# Patient Record
Sex: Female | Born: 1975 | Race: White | Hispanic: No | Marital: Married | Smoking: Current every day smoker
Health system: Southern US, Community
[De-identification: ages and names within clinical notes are randomized; demographics above are authoritative.]

## PROBLEM LIST (undated history)

## (undated) DIAGNOSIS — R3915 Urgency of urination: Secondary | ICD-10-CM

## (undated) DIAGNOSIS — K2970 Gastritis, unspecified, without bleeding: Secondary | ICD-10-CM

## (undated) DIAGNOSIS — N3010 Interstitial cystitis (chronic) without hematuria: Secondary | ICD-10-CM

## (undated) DIAGNOSIS — N941 Unspecified dyspareunia: Secondary | ICD-10-CM

## (undated) HISTORY — PX: ABDOMINAL HYSTERECTOMY: SHX81

## (undated) HISTORY — PX: BLADDER SURGERY: SHX569

---

## 2005-09-19 ENCOUNTER — Emergency Department: Payer: Self-pay | Admitting: Emergency Medicine

## 2006-01-16 ENCOUNTER — Emergency Department: Payer: Self-pay | Admitting: Emergency Medicine

## 2006-03-09 ENCOUNTER — Emergency Department: Payer: Self-pay | Admitting: Emergency Medicine

## 2006-04-22 ENCOUNTER — Inpatient Hospital Stay (HOSPITAL_COMMUNITY): Admission: AD | Admit: 2006-04-22 | Discharge: 2006-04-22 | Payer: Self-pay | Admitting: Gynecology

## 2006-04-28 ENCOUNTER — Ambulatory Visit: Payer: Self-pay | Admitting: Obstetrics & Gynecology

## 2006-04-28 ENCOUNTER — Ambulatory Visit: Payer: Self-pay | Admitting: Family Medicine

## 2006-04-28 ENCOUNTER — Encounter: Payer: Self-pay | Admitting: Family Medicine

## 2006-06-24 ENCOUNTER — Ambulatory Visit: Payer: Self-pay | Admitting: Family Medicine

## 2006-06-24 ENCOUNTER — Inpatient Hospital Stay (HOSPITAL_COMMUNITY): Admission: RE | Admit: 2006-06-24 | Discharge: 2006-06-26 | Payer: Self-pay | Admitting: Family Medicine

## 2006-06-24 ENCOUNTER — Encounter (INDEPENDENT_AMBULATORY_CARE_PROVIDER_SITE_OTHER): Payer: Self-pay | Admitting: Specialist

## 2006-06-29 ENCOUNTER — Ambulatory Visit: Payer: Self-pay | Admitting: Family Medicine

## 2006-07-07 ENCOUNTER — Ambulatory Visit: Payer: Self-pay | Admitting: Family Medicine

## 2006-07-20 ENCOUNTER — Inpatient Hospital Stay (HOSPITAL_COMMUNITY): Admission: AD | Admit: 2006-07-20 | Discharge: 2006-07-20 | Payer: Self-pay | Admitting: Gynecology

## 2006-07-28 ENCOUNTER — Ambulatory Visit: Payer: Self-pay | Admitting: Family Medicine

## 2006-08-04 ENCOUNTER — Ambulatory Visit (HOSPITAL_COMMUNITY): Admission: RE | Admit: 2006-08-04 | Discharge: 2006-08-04 | Payer: Self-pay | Admitting: Family Medicine

## 2006-10-25 ENCOUNTER — Emergency Department: Payer: Self-pay

## 2006-10-26 ENCOUNTER — Emergency Department: Payer: Self-pay | Admitting: Emergency Medicine

## 2006-12-01 ENCOUNTER — Ambulatory Visit: Payer: Self-pay | Admitting: Family Medicine

## 2007-03-26 IMAGING — US US TRANSVAGINAL NON-OB
1 series · 14 of 22 positions shown · non-contrast
Comparison: none

CLINICAL DATA: Followup left adnexal mass from 07/20/06 ultrasound; history of hysterectomy and right oophorectomy on 06/24/06. 
 TRANSVAGINAL PELVIC ULTRASOUND:
TECHNIQUE: Transvaginal ultrasound examination of the pelvis was performed including evaluation of the uterus, ovaries, adnexal regions, and pelvic cul-de-sac.

[Series 1: us transvaginal non-ob · 0.13mm/px · 14 of 22 slices shown]
[im 1/22]
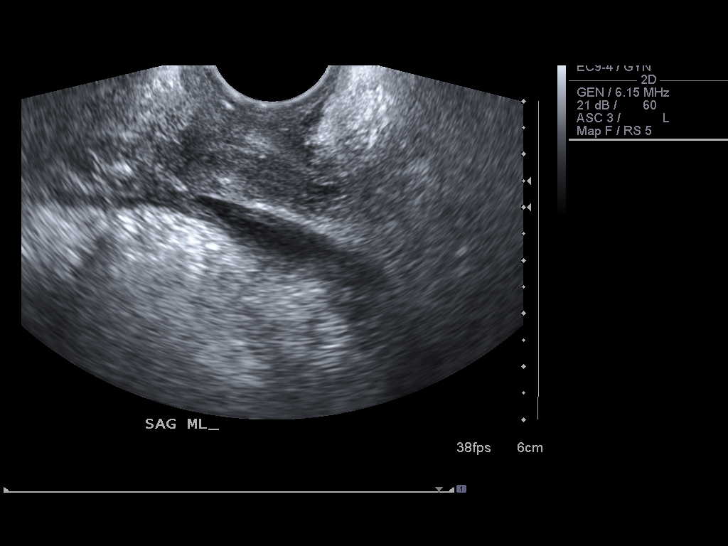
[im 3/22]
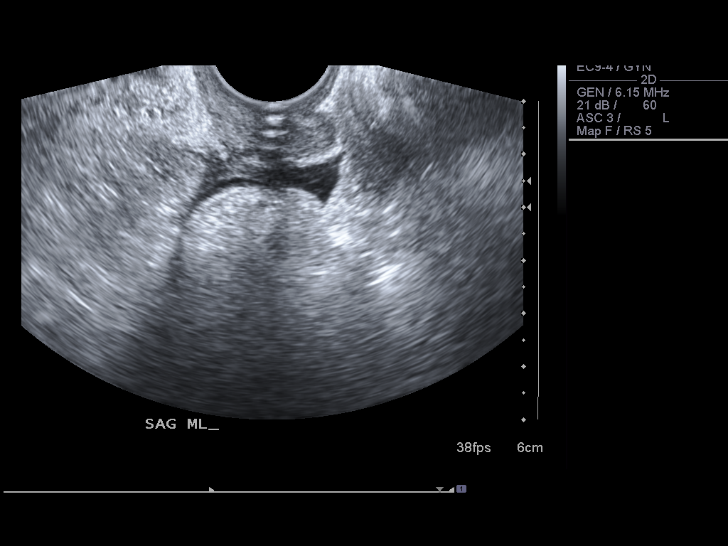
[im 4/22]
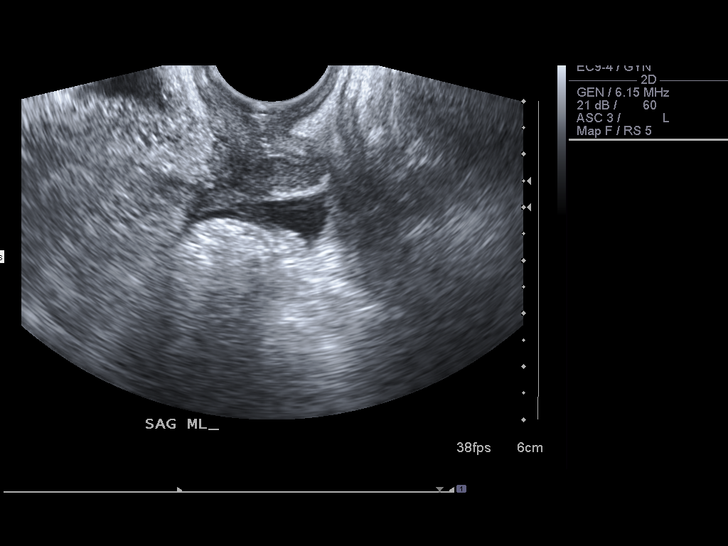
[im 6/22]
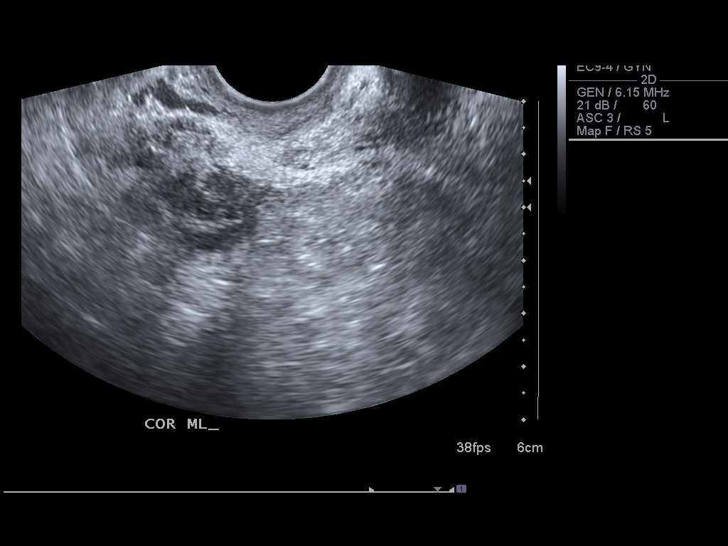
[im 8/22]
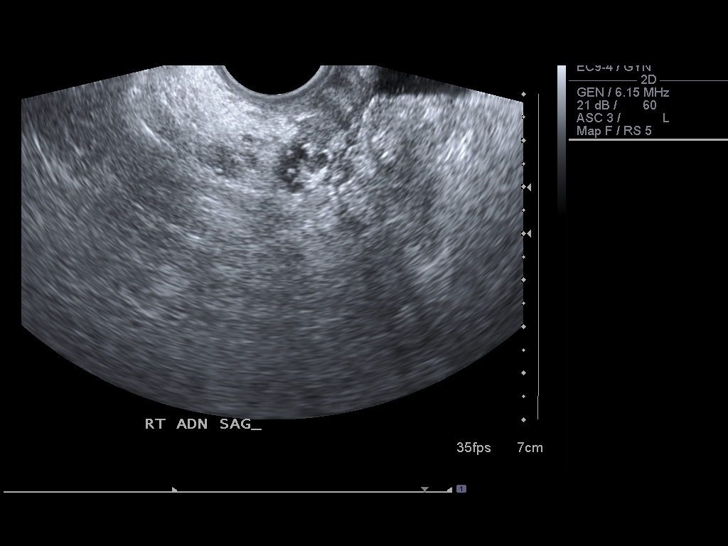
[im 9/22]
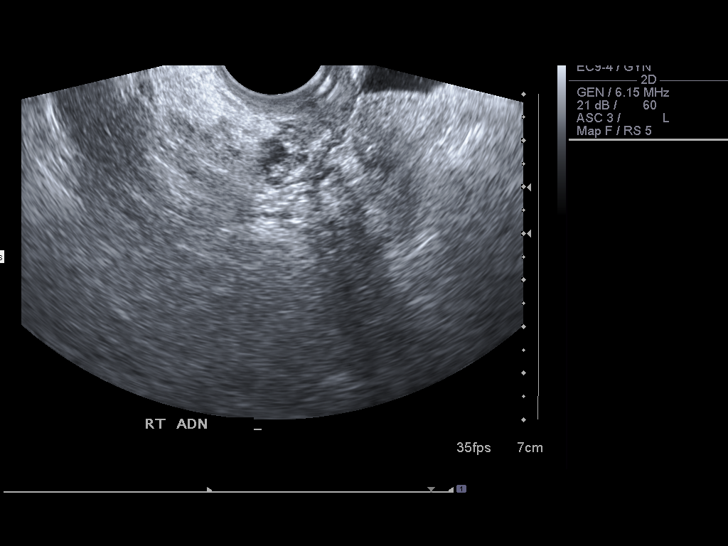
[im 11/22]
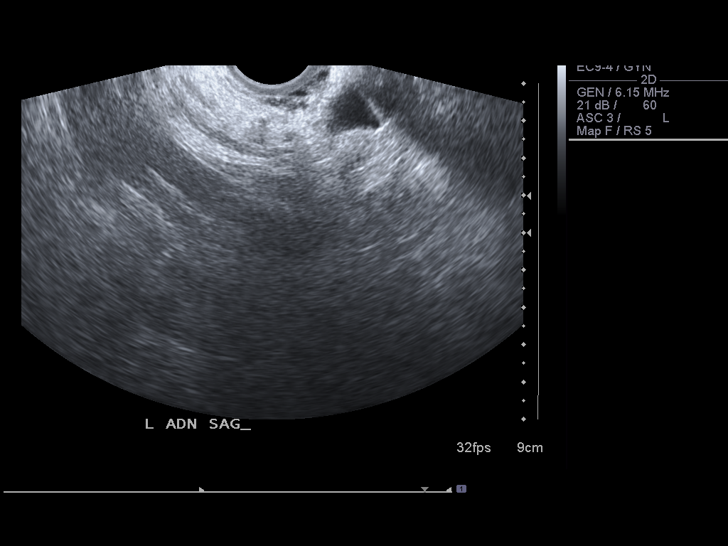
[im 12/22]
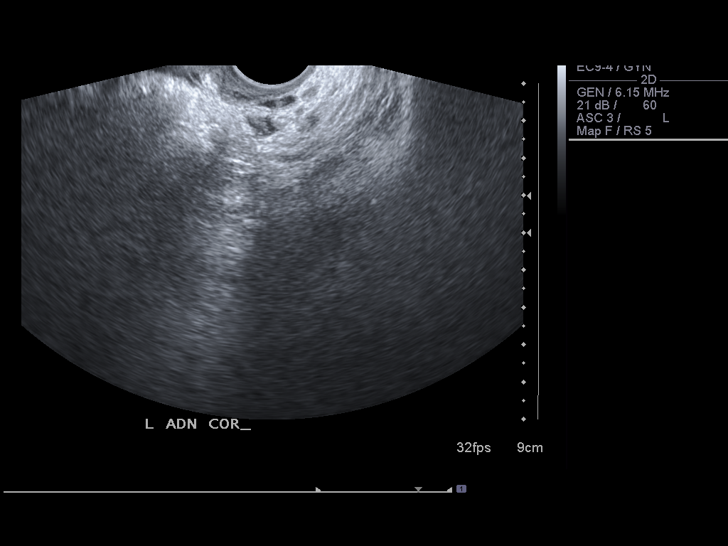
[im 14/22]
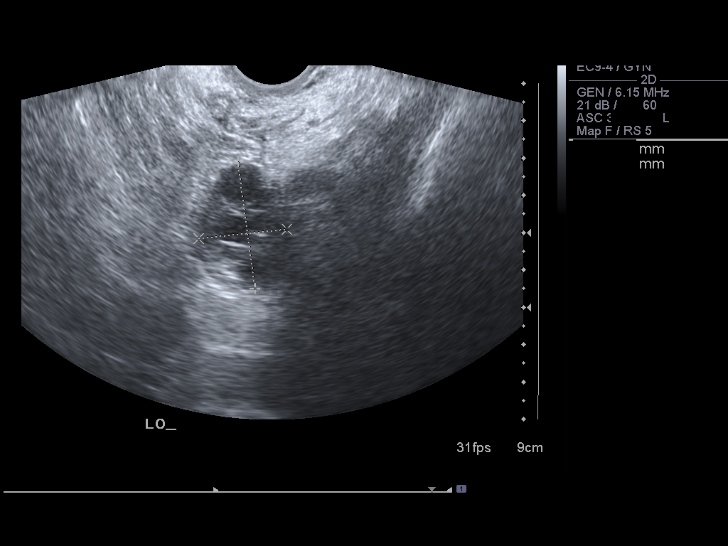
[im 15/22]
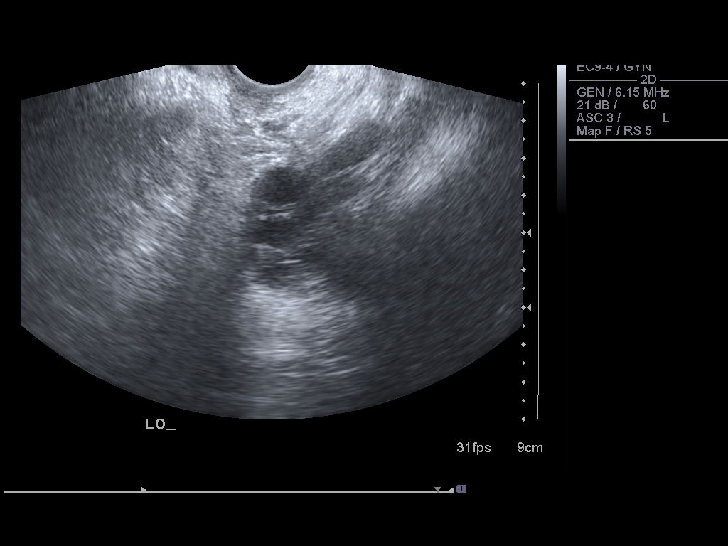
[im 17/22]
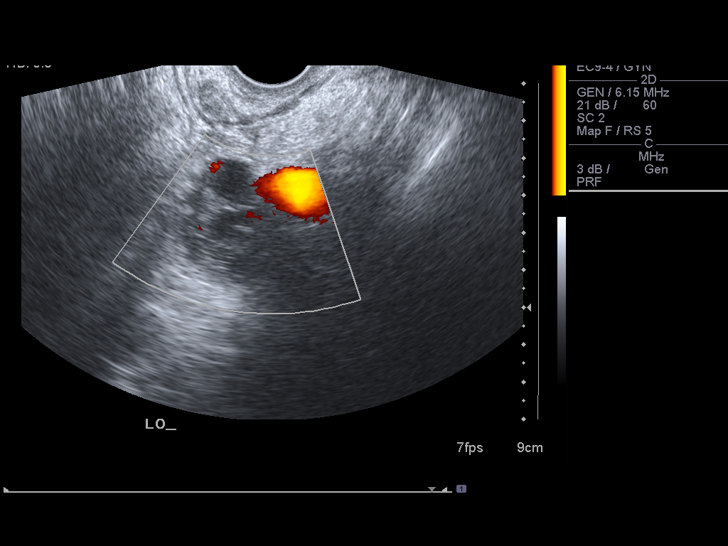
[im 19/22]
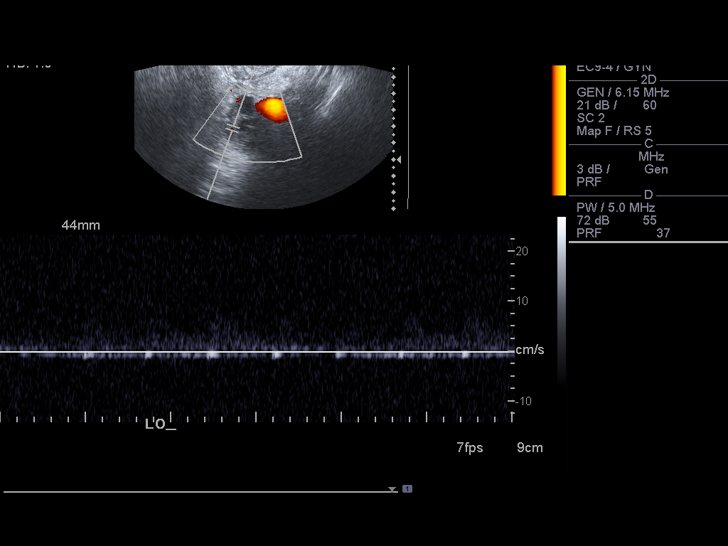
[im 20/22]
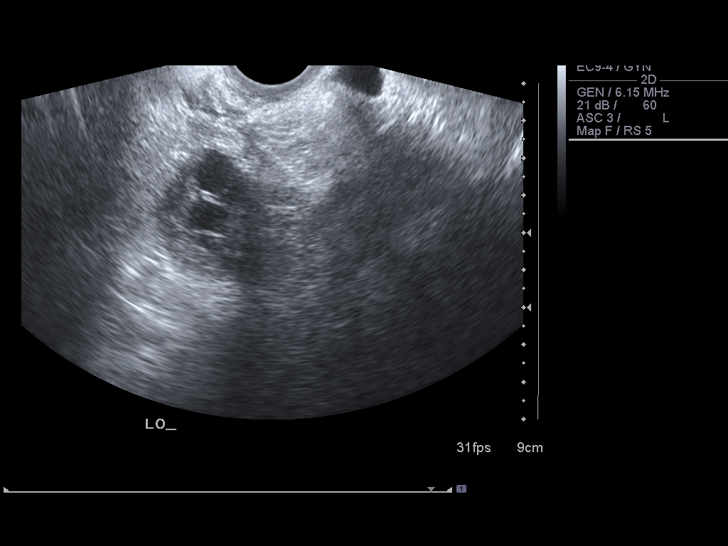
[im 22/22]
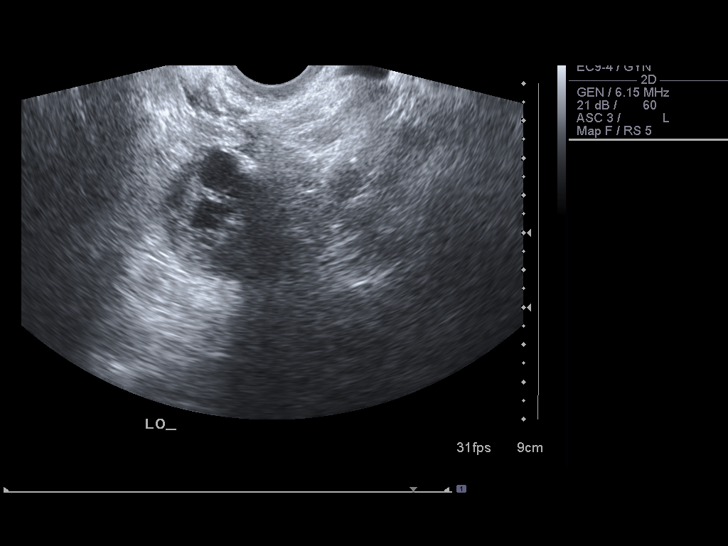

[14 of 22 positions shown; findings below may reference images not displayed]

FINDINGS: The left adnexal complex cyst which was previously seen on the 07/20/06 ultrasound is not present today.  The left ovary has a normal appearance today containing several small follicles.  Arterial and venous flow is present within the left ovary.  A minimal amount of free pelvic is incidentally noted.  The vaginal cuff has an unremarkable appearance.
IMPRESSION: 1. Interval resolution of left adnexal complex cyst since the 07/20/06 exam.
 2. The left ovary is well seen today and has a normal appearance.

## 2007-04-09 ENCOUNTER — Emergency Department: Payer: Self-pay | Admitting: Emergency Medicine

## 2007-04-12 ENCOUNTER — Emergency Department: Payer: Self-pay | Admitting: Emergency Medicine

## 2007-05-11 ENCOUNTER — Emergency Department: Payer: Self-pay | Admitting: Emergency Medicine

## 2007-11-08 ENCOUNTER — Emergency Department: Payer: Self-pay | Admitting: Emergency Medicine

## 2008-02-21 ENCOUNTER — Ambulatory Visit: Payer: Self-pay | Admitting: Family Medicine

## 2008-04-06 ENCOUNTER — Ambulatory Visit: Payer: Self-pay | Admitting: Urology

## 2008-04-13 ENCOUNTER — Ambulatory Visit: Payer: Self-pay | Admitting: Urology

## 2008-04-25 ENCOUNTER — Ambulatory Visit: Payer: Self-pay | Admitting: Family Medicine

## 2008-05-31 ENCOUNTER — Ambulatory Visit: Payer: Self-pay | Admitting: Obstetrics and Gynecology

## 2008-07-03 ENCOUNTER — Ambulatory Visit: Payer: Self-pay | Admitting: Family Medicine

## 2008-07-03 ENCOUNTER — Inpatient Hospital Stay (HOSPITAL_COMMUNITY): Admission: RE | Admit: 2008-07-03 | Discharge: 2008-07-05 | Payer: Self-pay | Admitting: Family Medicine

## 2008-07-03 ENCOUNTER — Encounter: Payer: Self-pay | Admitting: Family Medicine

## 2008-07-06 ENCOUNTER — Ambulatory Visit: Payer: Self-pay | Admitting: Family Medicine

## 2008-08-07 ENCOUNTER — Ambulatory Visit: Payer: Self-pay | Admitting: Obstetrics and Gynecology

## 2008-09-14 ENCOUNTER — Ambulatory Visit: Payer: Self-pay | Admitting: Family Medicine

## 2008-10-24 ENCOUNTER — Ambulatory Visit: Payer: Self-pay | Admitting: Obstetrics and Gynecology

## 2008-11-06 ENCOUNTER — Ambulatory Visit: Payer: Self-pay | Admitting: Internal Medicine

## 2008-11-06 DIAGNOSIS — R197 Diarrhea, unspecified: Secondary | ICD-10-CM

## 2008-11-06 DIAGNOSIS — R1031 Right lower quadrant pain: Secondary | ICD-10-CM

## 2008-11-06 LAB — CONVERTED CEMR LAB
ALT: 30 units/L (ref 0–35)
Albumin: 3.9 g/dL (ref 3.5–5.2)
Basophils Absolute: 0 10*3/uL (ref 0.0–0.1)
CO2: 32 meq/L (ref 19–32)
Calcium: 9.1 mg/dL (ref 8.4–10.5)
Chloride: 107 meq/L (ref 96–112)
Creatinine, Ser: 0.7 mg/dL (ref 0.4–1.2)
GFR calc non Af Amer: 102.67 mL/min (ref >60)
Hemoglobin: 13.1 g/dL (ref 12.0–15.0)
Lymphocytes Relative: 38.3 % (ref 12.0–46.0)
Monocytes Relative: 3.9 % (ref 3.0–12.0)
Neutro Abs: 2.4 10*3/uL (ref 1.4–7.7)
RBC: 4.27 M/uL (ref 3.87–5.11)
RDW: 12.4 % (ref 11.5–14.6)
Total Protein: 6.7 g/dL (ref 6.0–8.3)

## 2008-11-13 ENCOUNTER — Telehealth: Payer: Self-pay | Admitting: Internal Medicine

## 2008-11-20 ENCOUNTER — Encounter (INDEPENDENT_AMBULATORY_CARE_PROVIDER_SITE_OTHER): Payer: Self-pay | Admitting: *Deleted

## 2008-11-21 ENCOUNTER — Ambulatory Visit: Payer: Self-pay | Admitting: Internal Medicine

## 2008-11-27 ENCOUNTER — Telehealth: Payer: Self-pay | Admitting: Internal Medicine

## 2008-12-04 ENCOUNTER — Encounter (INDEPENDENT_AMBULATORY_CARE_PROVIDER_SITE_OTHER): Payer: Self-pay

## 2008-12-06 ENCOUNTER — Telehealth (INDEPENDENT_AMBULATORY_CARE_PROVIDER_SITE_OTHER): Payer: Self-pay

## 2009-07-16 ENCOUNTER — Emergency Department: Payer: Self-pay | Admitting: Emergency Medicine

## 2010-01-23 ENCOUNTER — Ambulatory Visit: Payer: Self-pay | Admitting: Family Medicine

## 2010-01-24 ENCOUNTER — Encounter: Payer: Self-pay | Admitting: Family Medicine

## 2010-01-24 ENCOUNTER — Ambulatory Visit: Payer: Self-pay | Admitting: Obstetrics and Gynecology

## 2010-01-24 LAB — CONVERTED CEMR LAB: FSH: 40.4 milliintl units/mL

## 2010-04-15 ENCOUNTER — Ambulatory Visit: Payer: Self-pay | Admitting: Obstetrics and Gynecology

## 2010-04-23 ENCOUNTER — Ambulatory Visit: Payer: Self-pay | Admitting: Family Medicine

## 2010-05-06 ENCOUNTER — Ambulatory Visit (HOSPITAL_COMMUNITY): Admission: RE | Admit: 2010-05-06 | Discharge: 2010-05-06 | Payer: Self-pay | Admitting: Family Medicine

## 2010-05-06 ENCOUNTER — Ambulatory Visit: Payer: Self-pay | Admitting: Family Medicine

## 2010-05-28 ENCOUNTER — Ambulatory Visit: Payer: Self-pay | Admitting: Family Medicine

## 2010-08-11 ENCOUNTER — Encounter: Payer: Self-pay | Admitting: Gynecology

## 2010-08-11 ENCOUNTER — Encounter: Payer: Self-pay | Admitting: Family Medicine

## 2010-10-02 LAB — CBC
HCT: 43.8 % (ref 36.0–46.0)
MCH: 29.8 pg (ref 26.0–34.0)
MCHC: 33.5 g/dL (ref 30.0–36.0)
MCV: 89 fL (ref 78.0–100.0)
RDW: 13.7 % (ref 11.5–15.5)

## 2010-10-22 ENCOUNTER — Ambulatory Visit: Payer: Self-pay | Admitting: Obstetrics and Gynecology

## 2010-10-28 ENCOUNTER — Ambulatory Visit: Payer: Medicaid Other | Admitting: Obstetrics & Gynecology

## 2010-10-28 DIAGNOSIS — N951 Menopausal and female climacteric states: Secondary | ICD-10-CM

## 2010-10-28 DIAGNOSIS — L748 Other eccrine sweat disorders: Secondary | ICD-10-CM

## 2010-10-29 NOTE — Assessment & Plan Note (Signed)
[name], [name] NO.:  [id number]  MEDICAL RECORD NO.:  [id number]           PATIENT TYPE:  LOCATION:  [facility] at [facility]           FACILITY:  PHYSICIAN:  Argentina Donovan, MD        DATE OF BIRTH:  08-04-1975  DATE OF SERVICE:  10/28/2010                                 CLINIC NOTE  The patient is a 35 year old gravida 2, para 2-0-0-2, who underwent total abdominal hysterectomy, right salpingo-oophorectomy which was later followed by left salpingo-oophorectomy.  She was placed on Vivelle patches for hot flashes, but they keep sweating so much they are coming off, want to switch her to Premarin 1.25, I am not sure the dose for her, she has very small.  We will have her come back in about a month and half and we will evaluate that.  In addition to this, she has a recurrent Skene gland cyst.  She had a previous abscess which was indeed marsupialized by Dr. Shawnie Pons.  She was also then referred to [facility] apparently, never was able to get there, was unable to be seen by Urology to rule out interstitial cystitis.  We have called [facility] in [place], and I am going to have him see her; #1 for the Skene gland, recurrent cyst, and also for the possibility of interstitial cystitis, if she continues to have pelvic pain in spite of having all gynecological organs removed, but I do not see any pathology here on the chart, and she said everything seemed to being stuck together with bad adhesions.  In addition to this, she is right at the present time quite miserable with discomfort, so I am going to give her some Percocet to cover her until she is seen by Urology.  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          ______________________________ Argentina Donovan, MD    PR/MEDQ  D:  10/28/2010  T:  10/29/2010  Job:  [id number]

## 2010-11-25 ENCOUNTER — Ambulatory Visit (INDEPENDENT_AMBULATORY_CARE_PROVIDER_SITE_OTHER): Payer: Medicaid Other | Admitting: Obstetrics & Gynecology

## 2010-11-25 DIAGNOSIS — N3090 Cystitis, unspecified without hematuria: Secondary | ICD-10-CM

## 2010-11-26 ENCOUNTER — Ambulatory Visit: Payer: Medicaid Other | Admitting: Family Medicine

## 2010-11-26 NOTE — Assessment & Plan Note (Signed)
[name], HEATHER NO.:  [id number]  MEDICAL RECORD NO.:  [id number]           PATIENT TYPE:  LOCATION:  [facility] at [facility]           FACILITY:  PHYSICIAN:  Scheryl Darter, MD       DATE OF BIRTH:  1976-01-28  DATE OF SERVICE:  11/25/2010                                 CLINIC NOTE  The patient returns today for followup after she saw Dr. Okey Dupre on October 29, 2010.  She has been referred to urologist at [facility] to rule out interstitial cystitis as well as to manage possible periurethral cyst.  She is scheduled to see Dr. Perley Jain on Dec 04, 2010.  She is likely to be scheduling her for management of the periurethral cyst as well as diagnostic procedures for possible interstitial cystitis.  When she was seen by Dr. Isabel Caprice on November 14, 2010, he gave her prescription for Percocet 5/325, 30 tablets, 1-2 p.o. q.4-6 hours as needed.  She has now run out of her prescription.  For about the last year, she has been on Percocet.  She requests a refill today.  She says she is doing well with her Premarin and she has refills.  It seems she has taken 2-3 Percocet a day now.  I discussed that she is at risk to become physically dependent on the narcotics, and would like to avoid that.  What we agreed to do today is for her to have a prescription for Percocet 5/325, 20 tablets one p.o. q.4-6 hours for pain.  No refills.  We will not refill this prescription here, and she should notify the urologist of that.  Because she has frequency and pain at night and difficulty sleeping, I gave her prescription for amitriptyline 25 mg p.o. nightly.  For now, she will follow with the urologist and I did not schedule any specific followup related to this at our office.     Scheryl Darter, MD    JA/MEDQ  D:  11/25/2010  T:  11/26/2010  Job:  [id number]

## 2010-12-03 NOTE — Assessment & Plan Note (Signed)
[name], [name]                ACCOUNT NO.:  [id number]   MEDICAL RECORD NO.:  [id number]          PATIENT TYPE:  POB   LOCATION:  [facility] at [facility]         FACILITY:  [facility]   PHYSICIAN:  Tinnie Gens, MD        DATE OF BIRTH:  02/28/1976   DATE OF SERVICE:  04/25/2008                                  CLINIC NOTE   CHIEF COMPLAINT:  Left-sided pelvic pain or abdominal pain.   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  The patient now has a left-sided  abdominal mass approximately 6 cm in diameter it has been present for  since pretty much postoperatively.  She has continued to have pain here  and desires permanent treatment of this.  The patient is currently a  Consulting civil engineer.  She is out of class on 12/12 and would like surgery scheduled  at that time.  The patient has a history of a peek and shriek with her  last laparoscopy and is not a candidate for laparoscopic removal of this  mass.   On exam, vitals are noted as in the chart.  She well-nourished female in  no acute distress.  Abdomen soft, nondistended.  It is tender in the  left lower quadrant.  Ultrasound reveals a 6 x 3 cm mass that is  approximately 5 cm from the abdominal wall, definitely unclear etiology  and likely related to her ovary.   IMPRESSION:  Left-sided pelvic mass, unclear etiology.   PLAN:  Ex-lap with possible LSO with excision of mass to be scheduled  after 07/01/2008.           ______________________________  Tinnie Gens, MD     TP/MEDQ  D:  04/25/2008  T:  04/26/2008  Job:  [id number]

## 2010-12-03 NOTE — Assessment & Plan Note (Signed)
[name], [name] NO.:  [id number]   MEDICAL RECORD NO.:  [id number]          PATIENT TYPE:  POB   LOCATION:  [facility] at [facility]         FACILITY:  [facility]   PHYSICIAN:  Argentina Donovan, MD        DATE OF BIRTH:  [birth year]   DATE OF SERVICE:  04/15/2010                                  CLINIC NOTE   The patient is a 35 year old Caucasian female who has been a long-time  patient of Dr. Shawnie Pons and has undergone a total abdominal hysterectomy  with right salpingo-oophorectomy and followed by left salpingo-  oophorectomy.  She has been a chronic pain patient for a long time, was  seen and thought to have a diverticulum of the urethra which was then  diagnosed as a Skene gland problem.  When last in July, she had an  abscess and a gland which was locally drained by Dr. Shawnie Pons and  successful.  Her visit today came in with a chief complaint of severe  abdominal pain onset caused by coitus about 1 week ago.  She has chronic  abdominal and pelvic pain without question of and the etiology of some  of her plain is difficult to explain.  However, recently questioning  her, she thinks it is a recurrence of the abscess and the lower  abdominal pain has gotten much worse.  Her urinary symptoms are of note,  although she has seen urologist in the past, it was mainly because of  thinking she had a urethral diverticulum and cystoscopy was not carried  out.  In talking with the patient and listening to her history, I am  fairly certain that this patient probably has interstitial cystitis.  Examination of her abdomen is soft, flat, quite tender in the suprapubic  area to the slightest superficial pressure, but without rebound.  When  the upper abdomen is palpated and compressed, this does increase her  lower abdominal pain.  On genital exam, the external genitalia is normal  and be less examination, there is a shiny bulging of the left Skene  gland which is fluctuant and I think  certainly has a recurrence of her  abscess.  When last seen Dr. Shawnie Pons had mentioned if the recurrence  occurred that she would do a I and D in the operating room and I think  that is probably a very good solution if this does not resolve prior to  her being seen by her.  On further examination, the entire vagina seems  to be very tender, but exquisite tenderness and the slightest palpation  of the anterior vaginal wall well above the urethra which certainly  causes immediate movement by the patient and wincing which I think  further points to the diagnosis of interstitial cystitis.  I have talked  to her little bit about treatment.  I am going to have her go back and  see her urologist and I think she does need a cystoscopy to confirm the  diagnosis.  I am going to put her on doxycycline and sitz baths in hopes  that she gets some kind of a relief and perhaps that this abscess will  either  resolve for the self-drain within the week until she sees Dr.  Shawnie Pons if not I think that in the operating room, a good I and D is  probably indicated, so she is going to start sitz baths, doxycycline.  I  am going to renew her Percocet as she definitely needs something for  pain at this point.  In addition to this fact that the patient is 20  years younger than the average menopause and on no estrogen for a year,  I am not quite clear on the reason that the internist suggested that she  stop the estrogen, but for her physical maintenance vaginal lubrication  in the cycle emetic effect of the estrogen, I think that she would be  better off back on estrogen.  That is a decision I will leave between  the patient and Dr. Shawnie Pons.   IMPRESSION:  Abscess of the left Skene gland, chronic pelvic pain  probably increased by what I think is interstitial cystitis.          ______________________________  Argentina Donovan, MD    PR/MEDQ  D:  04/15/2010  T:  04/16/2010  Job:  [id number]

## 2010-12-03 NOTE — Assessment & Plan Note (Signed)
NAMEBRITTNEE, [name]                ACCOUNT NO.:  [id number]   MEDICAL RECORD NO.:  [id number]          PATIENT TYPE:  POB   LOCATION:  [facility] at [facility]         FACILITY:  [facility]   PHYSICIAN:  Tinnie Gens, MD        DATE OF BIRTH:  January 16, 1976   DATE OF SERVICE:  02/21/2008                                  CLINIC NOTE   HISTORY OF PRESENT ILLNESS:  The patient is a 35 year old gravida 2,  para 2 who was previously had TAH, RSO for multiple adhesions and  chronic pelvic pain.  The patient reports that she has continued to have  some left lower quadrant pain, and she has some spottings from the  vagina.  The patient reports that she has 2 or 3 days of pain followed  by spotting, and she feels urgency to urinate.  She sits so much a lot  and strains but nothing really comes out.  She has been taking Aleve,  this is moderately helping.  The patient is, otherwise, being doing  fairly well.  The patient reports some history of a distal urethral mass  done by urologist in [facility] previously.   PHYSICAL EXAMINATION:  VITAL SIGNS:  Today, her vitals are as noted in  the chart.  GENERAL:  She is a well-nourished female in no acute distress.  ABDOMEN:  Soft, nontender, and nondistended.  GU:  There is a mass noted at the top of the introitus.  The vagina is  pink and rugated.  The vaginal cuff is clear and well-estrogenized  mucosa noted.  The patient has a mass anteriorly in the vagina.  It does  not get somewhat bigger with Valsalva.  It does not appear to be the  full bladder, but possibly a urethral diverticulum that is noted here  and with Valsalvas approximately 1.5 cm circumferentially.   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  2. Some left-sided pelvic pain, unclear etiology.   PLAN:  1. Pelvic sonogram to evaluate her left ovary.  2. We would refer to urologist, as I am uncomfortable to treat      urethral diverticula nor the complications of treating them.  3.  Continually her ibuprofen is necessary.  We will check a TSH and      FSH.  The patient has had some weight loss and some diarrheal      stools lately and some mood changes.           ______________________________  Tinnie Gens, MD     TP/MEDQ  D:  02/21/2008  T:  02/22/2008  Job:  [id number]

## 2010-12-03 NOTE — Assessment & Plan Note (Signed)
[name], [name]                ACCOUNT NO.:  [id number]   MEDICAL RECORD NO.:  [id number]          PATIENT TYPE:  POB   LOCATION:  [facility] at [facility]         FACILITY:  [facility]   PHYSICIAN:  Tinnie Gens, MD        DATE OF BIRTH:  1976-06-14   DATE OF SERVICE:  09/14/2008                                  CLINIC NOTE   CHIEF COMPLAINT:  Followup.   HISTORY OF PRESENT ILLNESS:  The patient is a 35 year old, gravida 2,  para 2 who has undergone TAH and RSO, followed by exploratory laparotomy  and LSO and  who has surgical menopause.  The patient had previously  been on Vivelle patch 0.1 and came in with complaints of anger and  management issues and trouble sleeping possibly sweats at night, she  told my partner.  He prescribed to her Premarin plus testosterone, which  she did not tolerate well.  She did not go quite into detail about  exactly what was intolerable about the medication, but then she switched  back to Vivelle.  She reports she has no menopausal symptoms, she is not  having hot flashes, not waken up at night, does not have a night sweats,  but she continues to have issue with short temper and anger management.  She would like some medication to help with this.  She has no history of  being on antidepressants before.  She does not really felt anxious or  nervous, she is in school full time, and try to raise her children and  does find that she is overwhelmed.   On exam today, her vital signs are noted in the chart.  She is a well  developed, well nourished, in no acute distress.  Abdomen:  Soft,  nontender, and nondistended.   IMPRESSION:  Short temper and anger, questionably related to menopausal  effect versus a slight stressors.   PLAN:  We will start Celexa 10 mg p.o. daily for a week and increase to  20 mg p.o. daily.  She will follow up in 4 weeks to see how this is  working for her.  Would give her at least 2 months of medication, if  that fails to help her,  would consider a referral to a therapist.           ______________________________  Tinnie Gens, MD     TP/MEDQ  D:  09/14/2008  T:  09/14/2008  Job:  [id number]

## 2010-12-03 NOTE — Op Note (Signed)
NAMECRYSTALEE, [name]                ACCOUNT NO.:  [id number]   MEDICAL RECORD NO.:  [id number]          PATIENT TYPE:  INP   LOCATION:  9312                          FACILITY:  [facility]   PHYSICIAN:  Tanya S. Shawnie Pons, M.D.   DATE OF BIRTH:  1976-03-18   DATE OF PROCEDURE:  07/03/2008  DATE OF DISCHARGE:                               OPERATIVE REPORT   PREOPERATIVE DIAGNOSIS:  Left ovarian cyst.   POSTOPERATIVE DIAGNOSIS:  Left ovarian cyst.   PROCEDURE:  Exploratory laparotomy and left salpingo-oophorectomy.   SURGEON:  Shelbie Proctor. Shawnie Pons, MD   ASSISTANT:  Norton Blizzard, MD   ANESTHESIA:  General and local.   FINDINGS:  Left ovarian mass of adhesions.   SPECIMEN:  Left ovary plus or minus the tube to pathology.   ESTIMATED BLOOD LOSS:  100 mL.   COMPLICATIONS:  None known.   REASON FOR PROCEDURE:  Briefly, the patient is a 35 year old who has had  2 prior cesarean sections plus BTL plus left ovarian cystectomy in the  past.  She has undergone a TAH and RSO for pelvic pain, and at that  time, we tried to save a very normal-appearing left ovary.  Since that  time, she has had chronic left-sided pelvic pain that has gotten worse,  and she was noted to have an approximately 6-cm mass.  This was thought  to be related to the left ovary in the left lower quadrant.   PROCEDURE IN DETAIL:  The patient was taken to the OR where she was  placed in supine position and prepped and draped in usual sterile  fashion.  Foley catheter was placed inside her bladder.  A Pfannenstiel  incision was made through her previous scar, carried down to the  underlying fascia, which was incised in the midline.  The fascial  incision was extended laterally.  Then, the superior and inferior edge  of the fascia was taken off the underlying rectus with the  electrocautery.  The peritoneal cavity was entered and of note, the  bowel was adhesed to the anterior abdominal wall.  Additionally, a mass  was noted in  the left lower quadrant and felt to be related to ovary.  There was adhesion of bowel and peritoneum to the mass.  With blunt and  careful sharp dissection, adhesions were taken down in general with a  Kelly clamp that was cut and then a free tie placed across this.  There  was a point where the IP encountered.  A long tubular structure was  adherent here, and this was taken down in case it was the ureter.  The  pedicle that was felt to be IP was taken in and was flushed followed by  stick tie.  There was still some bleeding noted and tonsil was used at  the bleeding site and a stick tie used to achieve hemostasis.  Pressure  was then placed and the area was felt to be hemostatic.  The ureter was  never seen with absolute confidence, but was not felt to be in any of  the pedicles given  how thin the pedicles were felt to be mostly  peritoneum been taken down in addition to the superficiality of them.  The tubular structure that had previously been taken down was eventually  felt to be a pulsating vessel.  Following removal of the ovarian mass,  which probably had tube adherent to it as a separate structure was not  found with confidence that was felt to be tube, and hemostasis was  achieved.  All laps removed from the abdomen and the fascia closed with  0 Vicryl suture in a running fashion.  Subcutaneous tissue irrigated.  Any bleeders cauterized and skin closed using clips and 20 cc of 0.25%  Marcaine were injected about the incision.  All instrument and lap  counts were correct x2.  The patient was awakened and taken to recovery  room in stable condition.      Shelbie Proctor. Shawnie Pons, M.D.  Electronically Signed     TSP/MEDQ  D:  07/03/2008  T:  07/04/2008  Job:  [id number]

## 2010-12-03 NOTE — Assessment & Plan Note (Signed)
[name], [name]                ACCOUNT NO.:  [id number]   MEDICAL RECORD NO.:  [id number]          PATIENT TYPE:  POB   LOCATION:  [facility]         FACILITY:  [facility]   PHYSICIAN:  Tinnie Gens, MD        DATE OF BIRTH:  [birth year]   DATE OF SERVICE:  12/01/2006                                  CLINIC NOTE   CHIEF COMPLAINT:  Pelvic pain.   HISTORY OF PRESENT ILLNESS:  Patient is a 35 year old gravida 2, para 2,  who underwent TAH/RSO for multiple adhesions and chronic pelvic pain.  Since that time, she reports being painfree postoperatively until last  month when she developed some discomfort on her left side.  The pain has  been coming and going, lasting for a couple of days, usually gets better  with ibuprofen.  She is concerned.  She also reports feeling like when  she tries to urinate, she cannot quite empty her bladder, and that makes  her feel the pain a little more persistently.  The pain is dull in  nature.   PHYSICAL EXAMINATION:  VITAL SIGNS:  On the chart.  GENERAL:  She is a well-developed, well-nourished, alert and oriented  female in no acute distress.  ABDOMEN:  Soft.  Exquisitely tender on the left side.  Somewhat out of  proportion.  PELVIC:  Normal external female genitalia.  The vagina is pink and  rugated.  Cuff is without lesions.  Adnexa are diffusely tender on the  left side.   Real-time ultrasound scanning was done.  The left ovary was visualized  and was felt to be 4.2 x 3.3 with a 2.5 x 2.4 cm cyst on that side.  It  does explain her pain.   IMPRESSION/PLAN:  Ovarian cyst, likely the cause of the left lower  quadrant pain.  Percocet for exquisite pain, otherwise try to use  ibuprofen.  Return if no significant improvement in 4-6 weeks.  At that  time, would schedule a formal ultrasound if her pain has not improved,  which may be done prior to followup.  1. Difficulty urination, unlikely to be urinary tract infections.      Patient has no  dysuria.  There was suprapubic tenderness and no      costovertebral angle tenderness.           ______________________________  Tinnie Gens, MD     TP/MEDQ  D:  12/01/2006  T:  12/01/2006  Job:  [id number]

## 2010-12-03 NOTE — Assessment & Plan Note (Signed)
[name], [name]                ACCOUNT NO.:  [id number]   MEDICAL RECORD NO.:  [id number]          PATIENT TYPE:  POB   LOCATION:  [facility] at [facility]         FACILITY:  [facility]   PHYSICIAN:  Tinnie Gens, MD        DATE OF BIRTH:  [birth year]   DATE OF SERVICE:  05/28/2010                                  CLINIC NOTE   CHIEF COMPLAINT:  Postop check.   HISTORY OF PRESENT ILLNESS:  The patient is a 35 year old gravida 2,  para 2 who has undergone TAH-RSO and then LSO for chronic pelvic pain.  She continues to have abdominal pain despite having all pelvic organs  removed.  She had been to Urology, but they have not done tests for  interstitial cystitis.  They have asked for her to be referred somewhere  else.  The patient has been on Percocet.  She reports having to take 4  Percocet instead of only 2 as directed secondary to her tolerance being  up and she has chronic pain.  She can have sex and she has pain after  intercourse, and she just wants her normal life back.  She does desire  treatment and does not want to be labeled as a drug seeker.   On exam today, her vitals are as noted in the chart.  She is a well-  developed, well-nourished female in no acute distress.  GU:  Normal  external female genitalia.  BUS is normal.  The area from  marsupialization in Skene gland is well healed and well estrogenized.   IMPRESSION:  1. Status post marsupialization of Skene gland with good result today.  2. Chronic abdominal pain, unclear etiology, chronic pain medication      use.   PLAN:  1. Continue Vivelle-Dot for postmenopause.  2. Continue to seek diagnosis.  We will refer to [facility]      Urology to rule out interstitial cystitis with appropriate      treatment as needed if that is indeed what her problem is.  I have      advised her to possibly switch to gabapentin.  A long talk was held      with this patient regarding chronic pain medication, her tolerance      which  seems to be up, her need for Percocet all the time.  We      discussed its abuse, how it is available on the street, which she      says she cannot afford and her need for a primary care physician or      pain clinic management.  I have advised her to start with primary      care physician and have written for her a prescription of      gabapentin.  She will start at 300 at bedtime for a week followed      by 300 twice daily for a week followed by 300 t.i.d.  Hopefully,      will block some of the nerve receptors of her chronic pain and      potentially decrease her need for Percocet.  She also requests a  prescription for Percocet, which I have given her today, #40.  She      really should not come back to this office for further pain      medicine needs as she really is without even pelvic organs and I      think she really would benefit from primary care as well as pain      management and I think this referral should be made.          ______________________________  Tinnie Gens, MD    TP/MEDQ  D:  05/28/2010  T:  05/29/2010  Job:  [id number]

## 2010-12-03 NOTE — Assessment & Plan Note (Signed)
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   HISTORY OF PRESENT ILLNESS:  The patient is a 35 year old, gravida 2,  para 2 that has undergone TAH/RSO and then followed by LSO for chronic  pelvic pain.  She continues to have chronic pelvic pain and however was  given to see Urology for interstitial cystitis per Dr. __________ notes  from last week.  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  This has recurred.  I do not  think this is causing her chronic pelvic pain, although she asked for  more pain medicine today.  Last time I did an I and D, we successfully  drained it.  However, given its recurrence, we would prefer  marsupialization to hopefully reach more permanent treatment of this  problem.  Additionally, the patient is postmenopausal.  Her FSH would  verify this with the level of 40 and so a discussion was had about going  back on hormone replacement therapy.  Additionally, it would be helpful  to have the vagina well estrogenized prior to surgery.   IMPRESSION:  1. Recurrence of left Skene gland abscess.  2. Chronic pelvic pain.   PLAN:  1. See the OR for marsupialization, refill Vivelle-Dot 0.1 mg twice      weekly.  2. Referral to urology.           ______________________________  Tinnie Gens, MD     TP/MEDQ  D:  04/23/2010  T:  04/24/2010  Job:  [id number]

## 2010-12-03 NOTE — Assessment & Plan Note (Signed)
[name], [name]                ACCOUNT NO.:  [id number]   MEDICAL RECORD NO.:  [id number]          PATIENT TYPE:  POB   LOCATION:  [facility] at [facility]         FACILITY:  [facility]   PHYSICIAN:  Argentina Donovan, MD        DATE OF BIRTH:  1975-10-08   DATE OF SERVICE:  08/07/2008                                  CLINIC NOTE   The patient is a 35 year old Caucasian female who 4 weeks ago underwent  a left salpingo-oophorectomy for pelvic pain, hydrosalpinx, and  hemorrhagic benign ovarian cyst.  She had previously had a total  abdominal hysterectomy and a right salpingo-oophorectomy.  Since that  time, she was placed on the estrogen patch, but she has had terrible  mood swings that she says just as not her she goes off with a slightest  bit of irritation.  She is not sleeping well.  She sweats at night.  Although, she says she is not sleeping well, she also says that when she  sound asleep she seems to hear everything around her.  She tries to get  up, but cannot get up.  She said this did not happen to her after her  previous surgery.  I am going to switch her to Premarin with  methyltestosterone for short time to see if that helps.  I will have her  come back in 6 weeks and we will see how she has done on that.  I do not  plan on keeping her on it too long, but I am going to put her on 2.5 mg  of Premarin with methyltestosterone and see how she handles that.  We  may have to add an anti-anxiety type of medication when she comes back.  Present time, postop exam was fine.  Incisions healed up well.           ______________________________  Argentina Donovan, MD     PR/MEDQ  D:  08/07/2008  T:  08/08/2008  Job:  [id number]

## 2010-12-03 NOTE — Assessment & Plan Note (Signed)
[name], [name]                ACCOUNT NO.:  [id number]   MEDICAL RECORD NO.:  [id number]          PATIENT TYPE:  POB   LOCATION:  [facility] at [facility]         FACILITY:  [facility]   PHYSICIAN:  Argentina Donovan, MD        DATE OF BIRTH:  05-Jan-1976   DATE OF SERVICE:  10/24/2008                                  CLINIC NOTE   The patient is a 35 year old gravida 2, para 2-0-0-2, who has had total  abdominal hysterectomy and bilateral salpingo-oophorectomy, had several  different surgery episodes because of chronic pelvic pain and pelvic  adhesions.  She has recently continued to have pain that she describes  as severe right lower quadrant crampy pain and severe pain with bowel  movements, most of the time diarrhea.  She has been completely worked up  by a urologist, who apparently had referred her to a pain clinic, but  she has never seen a gastroenterologist.  Talking to her, this lady  sounds as if she has IBS and desires at least to see a  gastroenterologist prior to being referred to a pain center.  I have  described that to her what IBS is in some degree and I am going to get  her referral there.  She has been put on Celexa for depression by Dr.  Shawnie Pons about 2 months ago and seems to be doing extremely well on that,  although she still has a difficult time sleeping at night.  The  urologist did give her a few Percocet for pain until she can get to her  pain clinic.  I am going to redo that for her and see if she then cannot  get some help from a gastroenterologist.  We have talked about chronic  pain and however it sometimes can be treated, but she have to first of  all rule out organic disease that is treatable.   IMPRESSION:  Chronic pelvic and abdominal pain, recurrent.  Probable  irritable bowel syndrome with prominent symptom of diarrhea and pain on  bowel movements.           ______________________________  Argentina Donovan, MD     PR/MEDQ  D:  10/24/2008  T:  10/25/2008   Job:  [id number]

## 2010-12-03 NOTE — Discharge Summary (Signed)
NAMEAUBRIONNA, ISTRE                ACCOUNT NO.:  [id number]   MEDICAL RECORD NO.:  [id number]          PATIENT TYPE:  INP   LOCATION:  9312                          FACILITY:  [facility]   PHYSICIAN:  Tanya S. Shawnie Pons, M.D.   DATE OF BIRTH:  Jun 05, 1976   DATE OF ADMISSION:  07/03/2008  DATE OF DISCHARGE:  07/05/2008                               DISCHARGE SUMMARY   FINAL DIAGNOSES:  1. Left-sided pelvic mass.  2. Chronic pelvic pain.  3. Tobacco use.   PROCEDURES:  Exploratory laparotomy and left salpingo-oophorectomy.   PERTINENT LABS:  Preoperative hemoglobin of 13.1 and postoperative  hemoglobin of 10.8.  Normal CMP.   REASON FOR ADMISSION:  The patient is a 35 year old G2, P2, on TH&RSO  previously for pelvic pain, who now has a left-sided pelvic mass, who  desires permanent treatment.   HOSPITAL COURSE:  The patient was admitted on the day of the above  procedure and underwent that.  Postoperatively, she was transferred to  the floor.  She did well on postoperative #1.  Her Foley was  discontinued.  She was ambulating and tolerating p.o. without  difficulty.  She had passed flatus.  She stayed for 36 hours post  procedure and on postoperative day #2, was felt stable for discharge.   DISCHARGE DISPOSITION AND CONDITION:  The patient discharged to home in  good condition.   FOLLOWUP:  In [employer].  She will get staples removed on  Thursday and will come for a postop appointment at 10:30 at the [facility].   DISCHARGE MEDICATIONS:  1. Percocet 5/325 one to two every 4-6 hours p.r.n. pain, #48.  2. Given an Vivelle-Dot 0.1 mg, apply to lower abdomen twice weekly.      Shelbie Proctor. Shawnie Pons, M.D.  Electronically Signed     TSP/MEDQ  D:  07/05/2008  T:  07/05/2008  Job:  [id number]

## 2010-12-04 ENCOUNTER — Other Ambulatory Visit (HOSPITAL_COMMUNITY): Payer: Self-pay | Admitting: Urology

## 2010-12-04 DIAGNOSIS — N340 Urethral abscess: Secondary | ICD-10-CM

## 2010-12-06 ENCOUNTER — Other Ambulatory Visit (HOSPITAL_COMMUNITY): Payer: Medicaid Other

## 2010-12-06 NOTE — Discharge Summary (Signed)
NAMEZHANIA, SHAHEEN                ACCOUNT NO.:  [id number]   MEDICAL RECORD NO.:  [id number]          PATIENT TYPE:  INP   LOCATION:  9313                          FACILITY:  [facility]   PHYSICIAN:  Tanya S. Shawnie Pons, M.D.   DATE OF BIRTH:  Mar 20, 1976   DATE OF ADMISSION:  06/24/2006  DATE OF DISCHARGE:  06/26/2006                               DISCHARGE SUMMARY   FINAL DIAGNOSES:  1. Pelvic pain.  2. Dysmenorrhea.  3. Dyspareunia.  4. Ovarian cyst.  5. Pelvic adhesive disease.   PROCEDURE:  Total abdominal hysterectomy, right salpingo-oophorectomy,  lysis of adhesions.   PERTINENT LABS:  Preoperative hemoglobin 13.1, postoperative hemoglobin  of 9.6.   REASON FOR ADMISSION:  Briefly, patient is a 35 year old gravida 2, para  2, who has had two prior C-sections.  Laparoscopy for ovarian cyst and  BTL, who had chronic pelvic pain, dysmenorrhea, had failed treatment  with oral contraceptives.  She desired permanent treatment.   HOSPITAL COURSE:  Patient came in, was admitted on the day of the above  procedure.  Please see operative note for full details of the procedure.  Postoperatively, she was transferred to the floor, where she did well.  Her Foley was discontinued on her postoperative day #1.  She was  ambulating, voiding without difficulty, and tolerating a regular diet.  She remained afebrile for 48 hours post surgery, was feeling well, and  was stable for discharge.   DISCHARGE DISPOSITION/CONDITION:  Patient is discharged home in good  condition.  Restrictions include no heavy lifting for the next six  weeks.  Discharge followup will be staple removal at the [facility]  office on Monday, followed by a two week postop check at the [facility]  office.   DISCHARGE MEDICATIONS:  Percocet 5/325 mg 1-2 p.o. q.4-6h. p.r.n. pain.           ______________________________  Shelbie Proctor. Shawnie Pons, M.D.     TSP/MEDQ  D:  06/26/2006  T:  06/26/2006  Job:  [id number]

## 2010-12-06 NOTE — Assessment & Plan Note (Signed)
[name], [name]                ACCOUNT NO.:  [id number]   MEDICAL RECORD NO.:  [id number]          PATIENT TYPE:  POB   LOCATION:  [facility] at [facility]         FACILITY:  [facility]   PHYSICIAN:  Tinnie Gens, MD        DATE OF BIRTH:  02-Sep-1975   DATE OF SERVICE:  07/07/2006                                  CLINIC NOTE   CHIEF COMPLAINT:  Post-op check.   HISTORY OF PRESENT ILLNESS:  The patient is a 35 year old gravida 2,  para 2, who is 2 weeks now status post TAH/RSO for pelvic adhesive  disease, pelvic pain.  The patient had an unremarkable postoperative  course.  She was having regular bowel movements and is without  significant complaints of pain.  The patient does report moodiness, hot  flashes, night sweats, all since her surgery.   PHYSICAL EXAMINATION:  ABDOMEN:  Soft, nontender, nondistended.  Her  incision is well healed and looks good.   IMPRESSION:  1. Status post total abdominal hysterectomy, right salpingo-      oophorectomy, doing well.  2. Hot flashes, moodiness and night sweats.   PLAN:  1. Check FSH today.  2. HRT as needed.  3. Follow up in 2 more weeks, still no heavy lifting.  May resume      intercourse in 2 weeks.           ______________________________  Tinnie Gens, MD     TP/MEDQ  D:  07/07/2006  T:  07/08/2006  Job:  [id number]

## 2010-12-06 NOTE — Op Note (Signed)
NAMECHRISTAL, [name]                ACCOUNT NO.:  [id number]   MEDICAL RECORD NO.:  [id number]          PATIENT TYPE:  WOC   LOCATION:  WOC                          FACILITY:  [facility]   PHYSICIAN:  Tanya S. Shawnie Pons, M.D.   DATE OF BIRTH:  Jul 09, 1976   DATE OF PROCEDURE:  06/24/2006  DATE OF DISCHARGE:                               OPERATIVE REPORT   PREOPERATIVE DIAGNOSIS:  Pelvic pain, dyspareunia, dysmenorrhea and  ovarian cyst.   POSTOPERATIVE DIAGNOSIS:  Pelvic pain, dyspareunia, dysmenorrhea and  ovarian cyst.  Pelvic adhesive disease.   OPERATION PERFORMED:  Diagnostic scope followed by total abdominal  hysterectomy, right salpingo-oophorectomy.   SURGEON:  Shelbie Proctor. Shawnie Pons, M.D.   ASSISTANT:  Ginger Carne, MD   ANESTHESIA:  General and local.   SPECIMENS:  Uterus, right tube and ovary to pathology.   ESTIMATED BLOOD LOSS:  300 mL.   COMPLICATIONS:  None.   INDICATIONS FOR PROCEDURE:  Briefly the patient is a 35 year old gravida  2, para 2, status post two prior cesarean sections plus bilateral tubal  ligation and scope for ovarian cyst who had known pelvic adhesive  disease and continued pelvic pain.  She had been tried on oral  contraceptives for decreasing ovarian cyst and decreasing her pain but  that did not work in the past and she desired permanent treatment.   DESCRIPTION OF PROCEDURE:  The patient was taken to the operating room.  She was placed in dorsal lithotomy position in Shirley stirrups.  She was  prepped and draped in the usual sterile fashion.  A red rubber catheter  was used to drain her bladder.  A speculum was placed in the vagina and  the cervix was visualized, grasped with a single toothed tenaculum  anteriorly and a Hulka tenaculum placed in side of this.  Attention was  then turned to the abdomen where the umbilicus was injected with 3 mL of  0.25% Marcaine.  Then a knife used to make an incision through the  umbilicus and into the underlying  until the peritoneal cavity was  encountered.  A Hasson trocar was used, was placed and pneumoperitoneum  created.  The scope was then placed inside this incision and multiple  adhesions were noted at the point of the incision.  Additionally, the  entire uterus appeared connected to the anterior abdominal wall and the  right ovary and tube appeared stuck to the anterior abdominal wall on  the right pelvic sidewall.  At this point decision was made to convert  to open.  All laparoscopy equipment was removed.  The fascia at the  umbilicus was closed with 0 Vicryl suture in figure-of-eight and the  skin closed with a subcutaneous running stitch.  Hulka tenaculum was  removed and a Foley catheter was placed into the bladder.  A  Pfannenstiel incision was then made and through the skin and was taken  to the underlying fascia with the electrocautery and any bleeders  cauterized, the fascia incised in the midline and opened laterally  sharply.  The fascia was then dissected with blunt and sharp  dissection  anterior superiorly off the rectus.  The peritoneal cavity was then  entered sharply and the peritoneum taken down in layers.  There were  some adhesions of omentum to the anterior abdominal wall and these were  taken down with either electrocautery or sequential clamp, cut and then  free ties used to secure the ends.  The uterus was stuck to the anterior  abdominal wall as previously stated and with blunt dissection this was  taken off.  The bladder appeared to be close to the uterus as well.  There was also lots of adhesions of the omentum to the patient's right  round ligament and tube and ovary and careful sharp and blunt dissection  was used to dissect this out.  The bladder was taken down with both  blunt and sharp dissection anteriorly until both rounds could be  positively identified.  At this point the rounds were brought up with  the Babcock clamp, suture ligated, cut with the  electrocautery and held.  The posterior peritoneum was then opened up laterally until the ureter  could be identified bilaterally.  Once the ureter was identified, the  tubo-ovarian pedicle was taken on the left with the Heaney clamp.  Free  tie plus suture ligature was used to secure this pedicle.  When this was  cut away the IP was identified on the patient's right side since this  tube and ovary appeared to be very adherent to the uterus.  The uterus  had been previously identified on the medial edge of the posterior leaf  of the broad ligament and was well below the point where the IC was  taken.  A Heaney clamp was then used to take it down and free tie plus  suture ligature was used to secure this pedicle.  The posterior  peritoneum was then taken down across the back of the uterus to lower  the ureters even further.  More sharp and blunt dissection was used  anteriorly to try to get down the bladder.  Once the cervix was  identified in the midline, the uterine arteries were taken with the  Heaney clamp close to the cervix.  Straight Kochers were then used to  dissect down to the cervix until the uterosacral ligaments were  obtained.  Each bite was suture ligated.  The uterosacral was also, the  Heaney stitch was used to provide more support.  At this point a knife  was used to incise around the cervix until the vagina was entered.  __________ scissors were then used to cut the vagina away from the  cervix.  The vagina was grasped with Kocher clamps, the angles were  suture ligated in a Heaney type fashion and the vaginal cuff closed with  0 Vicryl suture.  Attention was then turned to any bleeders.  there was  some bleeding along the ovary on the right side and a 2-0 Vicryl figure-  of-eight was used to correct this.  There was also some bleeding along  the bladder.  2-0 Vicryl was also used to obtain hemostasis here.  At the end all bleeding had been stopped.  The abdomen was  irrigated and  all instrument and lap pads were removed.  There was some bleeding along  the rectus which was cauterized with electrocautery.  The fascia was  closed with 2-0 Vicryl sutures starting at either end and meeting in the  midline.  The fascia and subcutaneous tissue was irrigated and any  bleeding cauterized  with electrocautery and skin closed using clips.  10  mL of 0.25% Marcaine were then injected into the incision and a pressure  dressing was applied.  All instrument and lap counts correct x2.  Patient was awakened and taken to recovery room in stable condition.           ______________________________  Shelbie Proctor Shawnie Pons, M.D.     TSP/MEDQ  D:  06/24/2006  T:  06/24/2006  Job:  [id number]

## 2010-12-06 NOTE — Assessment & Plan Note (Signed)
[name], [name]                ACCOUNT NO.:  [id number]   MEDICAL RECORD NO.:  [id number]          PATIENT TYPE:  POB   LOCATION:  [facility] at [facility]         FACILITY:  [facility]   PHYSICIAN:  Tinnie Gens, MD        DATE OF BIRTH:  Jan 13, 1976   DATE OF SERVICE:                                    CLINIC NOTE   CHIEF COMPLAINT:  Pelvic pain.   HISTORY OF PRESENT ILLNESS:  The patient is a 35 year old gravida 2, para 2  who has a unique OB/GYN history.  She had a previous C-section x2 with a  BTL, and was told that she had very significant pelvic adhesive disease at  that time.  She also has a history of having a mass removed from her left  ovary at [facility] in 2003, and a mass removed from her  urethra that she saw a urologist for in 2005.  The patient reports a several  day history of increasing right-sided abdominal pain.  She went to [facility], where she was found to have a 5 x 4 x 3 cm probable hemorrhagic  ovarian cyst.  It was exquisitely tender.  She has been on Percocet since  that time, and her pain seems a little better, but not completely improved.  The patient has previously been seen by [facility] physicians who have  attempted to manage her recurrent ovarian cysts and pelvic pain, but have  not managed it satisfactorily for her.  The patient reports that she has  painful intercourse and pretty much every morning after she has had  intercourse the night before, she has pain the entire day.   PAST MEDICAL HISTORY:  Negative.   PAST SURGICAL HISTORY:  She has had the C-section x2, plus tubal with the  last one.  Laparoscopy left ovarian cystectomy and repair of urethral  diverticula.   MEDICATIONS:  Percocet prn.   ALLERGIES:  NONE KNOWN   OB HISTORY:  G2, P2, one C-section for prematurity and a second elective  repeat.   GYN HISTORY:  Menarche at age 22.  Cycles are monthly.  They are seven days.  They are exquisitely painful for several  days prior to and for several days  into her cycle.   FAMILY HISTORY:  Heart disease.  She has a first cousin who died of ovarian  cancer at age 22.   SOCIAL HISTORY:  No tobacco, alcohol, or significant drug use.   REVIEW OF SYSTEMS:  A 14-point review of systems was reviewed and was  negative except as in the HPI.  Please see GYN history in the chart.   PHYSICAL EXAMINATION:  VITAL SIGNS:  Pulse was 86, blood pressure was  152/79, weight 164.  GENERAL:  She is a well-developed, well-nourished female in no acute  distress.  ABDOMEN:  Soft, nontender, nondistended.  EXTREMITIES:  No clubbing, cyanosis, or edema, 2+ distal pulses.  GU:  Normal external female genitalia.  Vagina is pink and rugated.  BUS  reveals slight bulge at the urethra, but no real cystocele noted.  Cervix is  nulliparous and without lesions.  The uterus is  exquisitely tender and exam  is very limited by the patient's guarding and tenderness.  Right is greater  than the left, but she has just diffuse exquisite pelvic tenderness.   SONOGRAM:  Review of sonogram performed on 04/22/2006 reveals an apparently  normal appearance of the uterus and left ovary; however, this right-sided  ovarian cyst that could be consistent with endometrioma versus hemorrhage  cyst.   IMPRESSION:  1. Yearly exam with Pap smear.  2. Ovarian cyst.  3. Acute and chronic pelvic pain.  4. Dyspareunia.  5. Dysmenorrhea.   PLAN:  After a length discussion with this patient who has tried and failed  oral contraceptives in the past, she elected to proceed with hysterectomy.  Given her history of prior surgeries and mention of adhesive disease as well  as the cyst on her ovary, suspect we should in laparoscopically to be sure  that this can be done vaginally and safely, and that her ovaries do not need  to be removed concomitantly.  Did discuss the implication of menopause at  age 28 with the patient.  She seems to understand that.  Also  discussed  limitations of surgery and how this might not significantly improve her  symptoms.  Will schedule her for LAVH.           ______________________________  Tinnie Gens, MD     TP/MEDQ  D:  04/28/2006  T:  04/29/2006  Job:  [id number]

## 2010-12-11 ENCOUNTER — Ambulatory Visit (HOSPITAL_COMMUNITY)
Admission: RE | Admit: 2010-12-11 | Discharge: 2010-12-11 | Disposition: A | Discharge status: Home / Self Care | Payer: Medicaid Other | Source: Ambulatory Visit | Attending: Urology | Admitting: Urology

## 2010-12-11 DIAGNOSIS — N340 Urethral abscess: Secondary | ICD-10-CM

## 2010-12-18 ENCOUNTER — Other Ambulatory Visit: Payer: Self-pay | Admitting: Family Medicine

## 2010-12-18 DIAGNOSIS — N340 Urethral abscess: Secondary | ICD-10-CM

## 2010-12-23 ENCOUNTER — Ambulatory Visit (HOSPITAL_COMMUNITY)
Admission: RE | Admit: 2010-12-23 | Discharge: 2010-12-23 | Disposition: A | Discharge status: Home / Self Care | Payer: Medicaid Other | Source: Ambulatory Visit | Attending: Family Medicine | Admitting: Family Medicine

## 2010-12-23 DIAGNOSIS — N368 Other specified disorders of urethra: Secondary | ICD-10-CM | POA: Insufficient documentation

## 2010-12-23 DIAGNOSIS — N340 Urethral abscess: Secondary | ICD-10-CM

## 2011-01-13 ENCOUNTER — Other Ambulatory Visit: Payer: Self-pay | Admitting: Urology

## 2011-01-13 ENCOUNTER — Encounter (HOSPITAL_COMMUNITY): Payer: Medicaid Other

## 2011-01-13 LAB — CBC
HCT: 42.1 % (ref 36.0–46.0)
Hemoglobin: 13.7 g/dL (ref 12.0–15.0)
MCH: 28.4 pg (ref 26.0–34.0)
MCHC: 32.5 g/dL (ref 30.0–36.0)
MCV: 87.3 fL (ref 78.0–100.0)

## 2011-01-21 ENCOUNTER — Other Ambulatory Visit: Payer: Self-pay | Admitting: Urology

## 2011-01-21 ENCOUNTER — Observation Stay (HOSPITAL_COMMUNITY)
Admission: RE | Admit: 2011-01-21 | Discharge: 2011-01-22 | Disposition: A | Discharge status: Home / Self Care | Payer: Medicaid Other | Source: Ambulatory Visit | Attending: Urology | Admitting: Urology

## 2011-01-21 DIAGNOSIS — N368 Other specified disorders of urethra: Secondary | ICD-10-CM | POA: Insufficient documentation

## 2011-01-21 DIAGNOSIS — Z01812 Encounter for preprocedural laboratory examination: Secondary | ICD-10-CM | POA: Insufficient documentation

## 2011-01-21 DIAGNOSIS — N3010 Interstitial cystitis (chronic) without hematuria: Principal | ICD-10-CM | POA: Insufficient documentation

## 2011-01-21 LAB — TYPE AND SCREEN
ABO/RH(D): O POS
Antibody Screen: NEGATIVE

## 2011-01-24 LAB — WOUND CULTURE: Culture: NO GROWTH

## 2011-01-26 LAB — ANAEROBIC CULTURE

## 2011-02-04 NOTE — Op Note (Signed)
NAMESANYLA, [name].:  [id number]  MEDICAL RECORD [name].:  [id number]  LOCATION:  1402                         FACILITY:  [facility]  PHYSICIAN:  Martina Sinner, MD DATE OF BIRTH:  02/09/1976  DATE OF PROCEDURE: DATE OF DISCHARGE:                              OPERATIVE REPORT   SURGEON:  Martina Sinner, MD  ASSISTANT:  Delia Chimes, NP  PREOPERATIVE DIAGNOSIS:  Periurethral cyst, pelvic pain.  POSTOPERATIVE DIAGNOSIS:  Periurethral cyst; interstitial cystitis.  SURGERY:  Removal of periurethral cyst x2; cystoscopy, hydrodistention.  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 MRI thought urethral diverticulum.  The patient was prepped and removed in the usual fashion.  She was MRSA positive.  Appropriate precautions were given.  Vancomycin was added to the preoperative antibiotics.  Leg position was maximized to minimize the risk of compartment syndrome, neuropathy and DVT.  I could easily see the periurethral cyst in the distal half and primarily near the meatus a little bit more to her left side.  I first cystoscoped the patient with a 21-French scope.  Bladder mucosa and trigone were normal.  There was [name] stitch, foreign body or carcinoma. She was hydrodistention 100 mL.  I emptied her bladder and reinspected her bladder and there was diffuse glomerulations throughout the bladder in keeping with interstitial cystitis.  There was [name] injury to bladder.  Bladder was emptied.  I then cystoscoped the patient.  I could not see any communication between the cyst or diverticulum and urethra.  I thought between 2 Allis clamps it was best to make approximately a 3 cm to 4 cm incision in the midline of the cyst.  There was very thin vaginal mucosa and underlying pubocervical fascia and it was pretty easy to enucleate the periurethral cyst for approximately three-quarters of it sitting next the urethra.  Two Allis clamps were  used bilaterally and I gave some posterior pressure delivering the cyst before I opened it. I was next to urethra, so I did not remove it posterior or cephalad surface  I then opened the cyst and there was yellow mucoid fluid that was removed.  Anaerobic and aerobic cultures were sent.  I excised approximately 80% of the cyst leaving its posterior wall.  There was a friable blood vessel at 12 o'clock that I finally oversewed with 3-0 Vicryl.  Cautery was also used.  There was [name] injury to urethra.  There was one dimple which I did not think was communicating with urethra more cephalad.  I used a sensor wire with a 5-French open-ended ureteral catheter and could not cannulate it.  I then filled the bladder with approximately 400 cc of saline mixed with indigo carmine.  I did my suprapubic pressure maneuver occluding urethral meatus 3 or 4 times and there was [name] exit of dye from any opening in the urethra.  I did a multilayer closure with 3-0 Vicryl to reapproximate underlying pubocervical fascia underneath the left side of the urethra and also to close dead space.  Before doing this, I could palpate a very small cyst that was in the left vaginal wall flap.  I could excise it.  It was approximately three- quarters of 1 cm by 0.5 cm in size.  I opened it again the discharge was very thick and it was sent for culture.  Copious irrigation with antibiotics was utilized.  Hemostasis was good.  Multilayer closure was done as described above.  I closed the anterior vaginal wall incision after trimming a few millimeters of the anterior vaginal wall mucosa with running 3-0 Vicryl.  Vaginal pack with Estrace cream was applied.  There was [name] injury to urethra.  Foley catheter was draining well.  We will be able to remove the catheter tomorrow morning because she did not have a diverticulum.  She will go home on Septra and pain medication.  We will keep an eye on her Percocet.  Once she comes  back to clinic, I will educate her about interstitial cystitis, perhaps it would be MRSA positive as a predisposition to her periurethral cyst. They almost looked like sebaceous cyst.          ______________________________ Martina Sinner, MD     SAM/MEDQ  D:  01/21/2011  T:  01/21/2011  Job:  [id number]  Electronically Signed by Alfredo Martinez MD on 02/04/2011 08:14:06 AM

## 2011-04-25 LAB — CBC
HCT: 31.5 % — ABNORMAL LOW (ref 36.0–46.0)
Hemoglobin: 10.8 g/dL — ABNORMAL LOW (ref 12.0–15.0)
Hemoglobin: 13.1 g/dL (ref 12.0–15.0)
MCHC: 33.2 g/dL (ref 30.0–36.0)
Platelets: 168 10*3/uL (ref 150–400)
RBC: 4.33 MIL/uL (ref 3.87–5.11)
WBC: 11.1 10*3/uL — ABNORMAL HIGH (ref 4.0–10.5)
WBC: 4.9 10*3/uL (ref 4.0–10.5)

## 2011-04-25 LAB — BASIC METABOLIC PANEL
CO2: 27 mEq/L (ref 19–32)
Calcium: 9.4 mg/dL (ref 8.4–10.5)
Chloride: 104 mEq/L (ref 96–112)
Creatinine, Ser: 0.59 mg/dL (ref 0.4–1.2)
GFR calc Af Amer: >60 mL/min (ref >60)
Sodium: 138 mEq/L (ref 135–145)

## 2011-05-14 ENCOUNTER — Emergency Department: Payer: Self-pay | Admitting: Emergency Medicine

## 2011-05-27 ENCOUNTER — Ambulatory Visit: Payer: Self-pay | Admitting: Specialist

## 2011-06-16 DIAGNOSIS — R350 Frequency of micturition: Secondary | ICD-10-CM | POA: Insufficient documentation

## 2011-06-16 DIAGNOSIS — N3010 Interstitial cystitis (chronic) without hematuria: Secondary | ICD-10-CM | POA: Insufficient documentation

## 2011-06-16 DIAGNOSIS — IMO0002 Reserved for concepts with insufficient information to code with codable children: Secondary | ICD-10-CM | POA: Insufficient documentation

## 2011-06-16 DIAGNOSIS — R300 Dysuria: Secondary | ICD-10-CM | POA: Insufficient documentation

## 2011-06-16 DIAGNOSIS — R109 Unspecified abdominal pain: Secondary | ICD-10-CM | POA: Insufficient documentation

## 2011-06-16 DIAGNOSIS — R3915 Urgency of urination: Secondary | ICD-10-CM | POA: Insufficient documentation

## 2011-08-11 ENCOUNTER — Telehealth: Payer: Self-pay | Admitting: *Deleted

## 2011-08-11 MED ORDER — ESTROGENS CONJUGATED 1.25 MG PO TABS: 1.2500 mg | ORAL_TABLET | Freq: Every day | ORAL | Status: DC

## 2011-08-11 NOTE — Telephone Encounter (Signed)
[name] is requesting a refill of her Premarin 1.25.  I have sent this to her pharmacy.   She is also requesting something for pain as her urologist that she is seeing is out of the office from last week due to the weather until next Monday and she is completely out.  She is currently being seen at [facility] and has been diagnosed with ICP and pelvic prolapse.  Her phone number is [phone].  She states that she had intercourse last night and it has flared up.  She does have an appointment next Monday when her urologist returns.

## 2012-09-13 ENCOUNTER — Telehealth: Payer: Self-pay | Admitting: *Deleted

## 2012-09-13 MED ORDER — ESTROGENS CONJUGATED 1.25 MG PO TABS: 1.2500 mg | ORAL_TABLET | Freq: Every day | ORAL | Status: DC

## 2012-09-13 NOTE — Telephone Encounter (Signed)
Pt called adv needs refill of Premarin

## 2013-03-30 DIAGNOSIS — R52 Pain, unspecified: Secondary | ICD-10-CM | POA: Insufficient documentation

## 2016-05-01 DIAGNOSIS — R5383 Other fatigue: Secondary | ICD-10-CM | POA: Insufficient documentation

## 2016-05-01 DIAGNOSIS — D696 Thrombocytopenia, unspecified: Secondary | ICD-10-CM | POA: Insufficient documentation

## 2016-05-01 DIAGNOSIS — M79641 Pain in right hand: Secondary | ICD-10-CM | POA: Insufficient documentation

## 2016-05-01 DIAGNOSIS — Z720 Tobacco use: Secondary | ICD-10-CM | POA: Insufficient documentation

## 2016-05-02 DIAGNOSIS — F321 Major depressive disorder, single episode, moderate: Secondary | ICD-10-CM | POA: Insufficient documentation

## 2016-09-23 ENCOUNTER — Emergency Department
Admission: EM | Admit: 2016-09-23 | Discharge: 2016-09-23 | Disposition: A | Discharge status: Home / Self Care | Payer: Medicaid Other | Attending: Emergency Medicine | Admitting: Emergency Medicine

## 2016-09-23 ENCOUNTER — Emergency Department: Payer: Medicaid Other

## 2016-09-23 ENCOUNTER — Encounter: Payer: Self-pay | Admitting: Emergency Medicine

## 2016-09-23 DIAGNOSIS — R1013 Epigastric pain: Secondary | ICD-10-CM

## 2016-09-23 DIAGNOSIS — R112 Nausea with vomiting, unspecified: Secondary | ICD-10-CM | POA: Insufficient documentation

## 2016-09-23 DIAGNOSIS — R197 Diarrhea, unspecified: Secondary | ICD-10-CM | POA: Diagnosis present

## 2016-09-23 DIAGNOSIS — F17200 Nicotine dependence, unspecified, uncomplicated: Secondary | ICD-10-CM | POA: Diagnosis not present

## 2016-09-23 DIAGNOSIS — R1012 Left upper quadrant pain: Secondary | ICD-10-CM | POA: Diagnosis not present

## 2016-09-23 HISTORY — DX: Urgency of urination: R39.15

## 2016-09-23 HISTORY — DX: Gastritis, unspecified, without bleeding: K29.70

## 2016-09-23 HISTORY — DX: Unspecified dyspareunia: N94.10

## 2016-09-23 HISTORY — DX: Interstitial cystitis (chronic) without hematuria: N30.10

## 2016-09-23 LAB — COMPREHENSIVE METABOLIC PANEL
ALBUMIN: 4.7 g/dL (ref 3.5–5.0)
ALT: 19 U/L (ref 14–54)
ANION GAP: 13 (ref 5–15)
AST: 32 U/L (ref 15–41)
Alkaline Phosphatase: 62 U/L (ref 38–126)
BILIRUBIN TOTAL: 0.8 mg/dL (ref 0.3–1.2)
BUN: 21 mg/dL — AB (ref 6–20)
CO2: 23 mmol/L (ref 22–32)
CREATININE: 0.86 mg/dL (ref 0.44–1.00)
Calcium: 9.6 mg/dL (ref 8.9–10.3)
Chloride: 105 mmol/L (ref 101–111)
GFR calc Af Amer: >60 mL/min (ref >60)
GFR calc non Af Amer: >60 mL/min (ref >60)
GLUCOSE: 135 mg/dL — AB (ref 65–99)
Potassium: 3.9 mmol/L (ref 3.5–5.1)
Sodium: 141 mmol/L (ref 135–145)
TOTAL PROTEIN: 7.7 g/dL (ref 6.5–8.1)

## 2016-09-23 LAB — CBC
HCT: 43.7 % (ref 35.0–47.0)
Hemoglobin: 14.7 g/dL (ref 12.0–16.0)
MCH: 28 pg (ref 26.0–34.0)
MCHC: 33.6 g/dL (ref 32.0–36.0)
MCV: 83.5 fL (ref 80.0–100.0)
PLATELETS: 149 10*3/uL — AB (ref 150–440)
RBC: 5.23 MIL/uL — ABNORMAL HIGH (ref 3.80–5.20)
RDW: 14.4 % (ref 11.5–14.5)
WBC: 7.5 10*3/uL (ref 3.6–11.0)

## 2016-09-23 LAB — LIPASE, BLOOD: Lipase: 15 U/L (ref 11–51)

## 2016-09-23 MED ORDER — IOPAMIDOL (ISOVUE-300) INJECTION 61%
30.0000 mL | Freq: Once | INTRAVENOUS | Status: AC | PRN
  Administered 2016-09-23: 30 mL via ORAL

## 2016-09-23 MED ORDER — MORPHINE SULFATE (PF) 4 MG/ML IV SOLN
6.0000 mg | Freq: Once | INTRAVENOUS | Status: AC
  Administered 2016-09-23: 6 mg via INTRAVENOUS

## 2016-09-23 MED ORDER — HYDROMORPHONE HCL 1 MG/ML IJ SOLN
1.0000 mg | Freq: Once | INTRAMUSCULAR | Status: AC
  Administered 2016-09-23: 1 mg via INTRAVENOUS
  Filled 2016-09-23: qty 1

## 2016-09-23 MED ORDER — OXYCODONE-ACETAMINOPHEN 5-325 MG PO TABS: 1.0000 | ORAL_TABLET | ORAL | 0 refills | Status: AC | PRN

## 2016-09-23 MED ORDER — MORPHINE SULFATE (PF) 2 MG/ML IV SOLN
INTRAVENOUS | Status: AC
  Filled 2016-09-23: qty 1

## 2016-09-23 MED ORDER — ONDANSETRON HCL 4 MG/2ML IJ SOLN
4.0000 mg | Freq: Once | INTRAMUSCULAR | Status: AC
  Administered 2016-09-23: 4 mg via INTRAVENOUS
  Filled 2016-09-23: qty 2

## 2016-09-23 MED ORDER — SODIUM CHLORIDE 0.9 % IV BOLUS (SEPSIS)
1000.0000 mL | Freq: Once | INTRAVENOUS | Status: AC
  Administered 2016-09-23: 1000 mL via INTRAVENOUS

## 2016-09-23 MED ORDER — MORPHINE SULFATE (PF) 4 MG/ML IV SOLN
INTRAVENOUS | Status: AC
  Filled 2016-09-23: qty 1

## 2016-09-23 MED ORDER — IOPAMIDOL (ISOVUE-300) INJECTION 61%
100.0000 mL | Freq: Once | INTRAVENOUS | Status: AC | PRN
  Administered 2016-09-23: 100 mL via INTRAVENOUS

## 2016-09-23 MED ORDER — FENTANYL CITRATE (PF) 100 MCG/2ML IJ SOLN
75.0000 ug | Freq: Once | INTRAMUSCULAR | Status: AC
  Administered 2016-09-23: 75 ug via INTRAVENOUS
  Filled 2016-09-23: qty 2

## 2016-09-23 MED ORDER — ONDANSETRON 4 MG PO TBDP
4.0000 mg | ORAL_TABLET | Freq: Three times a day (TID) | ORAL | 0 refills | Status: DC | PRN
Start: 2016-09-23 — End: 2020-08-03

## 2016-09-23 MED ORDER — LOPERAMIDE HCL 2 MG PO TABS: 2.0000 mg | ORAL_TABLET | Freq: Four times a day (QID) | ORAL | 0 refills | Status: DC | PRN

## 2016-09-23 NOTE — Discharge Instructions (Signed)
Please make an appointment with your primary care physician for reevaluation. Please make sure you wash her hands frequently and well to prevent the spread of infection.  You may take Zofran for nausea, and Tylenol for mild to moderate pain. Percocet is for severe pain. Do not drive within 8 hours of taking Percocet. You may take loperamide for diarrhea.  Return to the emergency department if you develop worsening pain, fever, inability to keep down fluids, lightheadedness or fainting, or any other symptoms concerning to you.

## 2016-09-23 NOTE — ED Notes (Signed)
Signature pad not working at this time. Patient educated on discharge prescriptions and instructions. Patient verbalizes understanding.

## 2016-09-23 NOTE — ED Triage Notes (Signed)
Started with NVD Saturday and upper abdominal pain. Has stimulator for bladder per pt. Cannot keep anything down.

## 2016-09-23 NOTE — ED Notes (Signed)
Patient transported to CT 

## 2016-09-23 NOTE — ED Provider Notes (Addendum)
[facility] Emergency Department Provider Note  ____________________________________________  Time seen: Approximately 8:46 AM  I have reviewed the triage vital signs and the nursing notes.   HISTORY  Chief Complaint Abdominal Pain    HPI [name] is a 41 y.o. female with a history of bladder stimulator for difficulty voiding, status post hysterectomy and C-section 2, peptic ulcer disease not currently treated, presenting with severe epigastric and left upper quadrant pain. The pain started 2 days ago, and yesterday at 1:30 PM she began to have nausea and vomiting and has been unable to tolerate any liquid or food since then. At 3 AM she began to have watery, nonbloody diarrhea. No fevers or chills, urinary symptoms. She has never had symptoms like this before. Patient did not have her flu shot this year; denies any cough, rhinorrhea or congestion, sore throat or ear pain.   Past Medical History:  Diagnosis Date  . Dyspareunia, female   . Gastritis   . Interstitial cystitis   . Urinary urgency     Patient Active Problem List   Diagnosis Date Noted  . DIARRHEA 11/06/2008  . ABDOMINAL PAIN-RLQ 11/06/2008    Past Surgical History:  Procedure Laterality Date  . ABDOMINAL HYSTERECTOMY    . BLADDER SURGERY    . CESAREAN SECTION      Current Outpatient Rx  . Order #: [id number] Class: Normal  . Order #: [id number] Class: Normal  . Order #: [id number] Class: Print  . Order #: [id number] Class: Print  . Order #: [id number] Class: Print    Allergies Patient has no known allergies.  History reviewed. No pertinent family history.  Social History Social History  Substance Use Topics  . Smoking status: Current Every Day Smoker  . Smokeless tobacco: Current User  . Alcohol use No    Review of Systems Constitutional: No fever/chills.No lightheadedness or syncope. Eyes: No visual changes. ENT: No sore throat. No congestion or rhinorrhea. Cardiovascular:  Denies chest pain. Denies palpitations. Respiratory: Denies shortness of breath.  No cough. Gastrointestinal: Positive epigastric and left upper quadrant abdominal pain.  Positive nausea, positive vomiting.  Positive diarrhea.  No constipation. Genitourinary: Negative for dysuria. No urinary frequency. Musculoskeletal: Negative for back pain. Skin: Negative for rash. Neurological: Negative for headaches. No focal numbness, tingling or weakness.   10-point ROS otherwise negative.  ____________________________________________   PHYSICAL EXAM:  VITAL SIGNS: ED Triage Vitals  Enc Vitals Group     BP 09/23/16 0827 (!) 96/50     Pulse Rate 09/23/16 0827 64     Resp 09/23/16 0827 18     Temp 09/23/16 0827 97.8 F (36.6 C)     Temp Source 09/23/16 0827 Oral     SpO2 09/23/16 0827 100 %     Weight 09/23/16 0822 140 lb (63.5 kg)     Height 09/23/16 0822 5\' 1"  (1.549 m)     Head Circumference --      Peak Flow --      Pain Score 09/23/16 0822 10     Pain Loc --      Pain Edu? --      Excl. in GC? --     Constitutional: Alert and oriented. Answers questions appropriately.Patient is significantly uncomfortable, doubled over holding her abdomen. Nontoxic. Eyes: Conjunctivae are normal.  EOMI. No scleral icterus. Head: Atraumatic. Nose: No congestion/rhinnorhea. Mouth/Throat: Mucous membranes are dry.  Neck: No stridor.  Supple.  No meningismus. Cardiovascular: Normal rate, regular rhythm. No murmurs, rubs or gallops.  Respiratory: Normal respiratory effort.  No accessory muscle use or retractions. Lungs CTAB.  No wheezes, rales or ronchi. Gastrointestinal: Soft and nondistended.  Tender to palpation in the epigastrium, greater than the left upper quadrant. No guarding or rebound.  No peritoneal signs. Musculoskeletal: No LE edema.  Neurologic:  A&Ox3.  Speech is clear.  Face and smile are symmetric.  EOMI.  Moves all extremities well. Skin:  Skin is warm, dry and intact. No rash  noted. Psychiatric: Mood and affect are normal. Speech and behavior are normal.  Normal judgement.  ____________________________________________   LABS (all labs ordered are listed, but only abnormal results are displayed)  Labs Reviewed  COMPREHENSIVE METABOLIC PANEL - Abnormal; Notable for the following:       Result Value   Glucose, Bld 135 (*)    BUN 21 (*)    All other components within normal limits  CBC - Abnormal; Notable for the following:    RBC 5.23 (*)    Platelets 149 (*)    All other components within normal limits  LIPASE, BLOOD  URINALYSIS, COMPLETE (UACMP) WITH MICROSCOPIC   ____________________________________________  EKG  ED ECG REPORT I, Rockne MenghiniNorman, Anne-Caroline, the attending physician, personally viewed and interpreted this ECG.   Date: 09/23/2016  EKG Time: 826  Rate: 67  Rhythm: normal sinus rhythm  Axis: normal  Intervals:none  ST&T Change: Nonspecific T-wave inversion in V1. Right bundle branch block. No STEMI.  ____________________________________________  RADIOLOGY  Ct Abdomen Pelvis W Contrast  Result Date: 09/23/2016 CLINICAL DATA:  Nausea, vomiting and upper abdominal pain since 09/20/2016. EXAM: CT ABDOMEN AND PELVIS WITH CONTRAST TECHNIQUE: Multidetector CT imaging of the abdomen and pelvis was performed using the standard protocol following bolus administration of intravenous contrast. CONTRAST:  100 ml ISOVUE-300 IOPAMIDOL (ISOVUE-300) INJECTION 61% COMPARISON:  CT abdomen and pelvis 04/04/2011. FINDINGS: Lower chest: Lung bases are clear. No pleural or pericardial effusion. Hepatobiliary: No focal liver abnormality is seen. No gallstones, gallbladder wall thickening, or biliary dilatation. Pancreas: Unremarkable. No pancreatic ductal dilatation or surrounding inflammatory changes. Spleen: Normal in size without focal abnormality. Adrenals/Urinary Tract: Tiny cyst lower pole right kidney is noted. The kidneys are otherwise unremarkable.  Adrenal glands appear normal. Stomach/Bowel: Stomach is within normal limits. Appendix appears normal. No evidence of bowel wall thickening, distention, or inflammatory changes. Vascular/Lymphatic: No significant vascular findings are present. No enlarged abdominal or pelvic lymph nodes. Reproductive: Status post hysterectomy. No adnexal masses. Other: Small volume of free pelvic fluid is greater than typically seen in physiologic change. Musculoskeletal: No acute bony abnormality. Sacral stimulator is in place. IMPRESSION: Small volume of free pelvic fluid is greater than typically seen in physiologic change. Source of the fluid is not identified. Question gastroenteritis. The exam is otherwise negative. Electronically Signed   By: Drusilla Kannerhomas  Dalessio M.D.   On: 09/23/2016 10:24   Dg Chest Portable 1 View  Result Date: 09/23/2016 CLINICAL DATA:  Epigastric pain with nausea and vomiting. EXAM: PORTABLE CHEST 1 VIEW COMPARISON:  None. FINDINGS: The heart size and mediastinal contours are within normal limits. Both lungs are clear. The visualized skeletal structures are unremarkable. IMPRESSION: Normal chest. Electronically Signed   By: Francene BoyersJames  Maxwell M.D.   On: 09/23/2016 09:12    ____________________________________________   PROCEDURES  Procedure(s) performed: None  Procedures  Critical Care performed: No ____________________________________________   INITIAL IMPRESSION / ASSESSMENT AND PLAN / ED COURSE  Pertinent labs & imaging results that were available during my care of the patient were  reviewed by me and considered in my medical decision making (see chart for details).  41 y.o. female with a history of multiple abdominal surgeries presenting with severe epigastric and left upper quadrant pain associated with nausea vomiting and diarrhea. Overall, the patient has reassuring vital signs and is afebrile, but she is significantly uncomfortable on my examination. We'll get a CT scan for further  evaluation would consider peptic ulcer disease or GERD, as well as possible complication including perforation. A GI illness is also possible. Plan to initiate immediate symptomatic treatment, and reevaluate the patient for final disposition.  ----------------------------------------- 10:46 AM on 09/23/2016 -----------------------------------------  The patient's workup in the emergency department has been reassuring. She did require a significant amount of pain medications to improve her pain, but at this point is comfortable. She is also able to tolerate liquid without vomiting and has had no more episodes of diarrhea in the emergency department. Her CT scan does not show any acute findings, and her chest x-ray does not show any free air in the abdomen.  It is possible that she may have a flulike illness or viral GI illness, or foodborne illness in the GI tract, and I will send her home with symptomatic treatment, and instructions to maintain hydration. She understands return precautions as well as follow-up instructions.  The patient was not able to give a urine sample, but has not been having any urinary symptoms, and I'll have her pain is upper abdomen. I have encouraged her to follow up with her PMD if she develops any symptoms. ____________________________________________  FINAL CLINICAL IMPRESSION(S) / ED DIAGNOSES  Final diagnoses:  Nausea vomiting and diarrhea  Epigastric pain  Left upper quadrant pain         NEW MEDICATIONS STARTED DURING THIS VISIT:  New Prescriptions   LOPERAMIDE (IMODIUM A-D) 2 MG TABLET    Take 1 tablet (2 mg total) by mouth 4 (four) times daily as needed for diarrhea or loose stools.   ONDANSETRON (ZOFRAN ODT) 4 MG DISINTEGRATING TABLET    Take 1 tablet (4 mg total) by mouth every 8 (eight) hours as needed for nausea or vomiting.   OXYCODONE-ACETAMINOPHEN (ROXICET) 5-325 MG TABLET    Take 1 tablet by mouth every 4 (four) hours as needed.       Rockne Menghini, MD 09/23/16 1048    Rockne Menghini, MD 09/23/16 [phone]

## 2017-01-28 ENCOUNTER — Emergency Department
Admission: EM | Admit: 2017-01-28 | Discharge: 2017-01-28 | Disposition: A | Discharge status: Home / Self Care | Payer: Medicaid Other | Attending: Emergency Medicine | Admitting: Emergency Medicine

## 2017-01-28 ENCOUNTER — Encounter: Payer: Self-pay | Admitting: Emergency Medicine

## 2017-01-28 DIAGNOSIS — F17200 Nicotine dependence, unspecified, uncomplicated: Secondary | ICD-10-CM | POA: Diagnosis not present

## 2017-01-28 DIAGNOSIS — Z23 Encounter for immunization: Secondary | ICD-10-CM | POA: Diagnosis not present

## 2017-01-28 DIAGNOSIS — S61011A Laceration without foreign body of right thumb without damage to nail, initial encounter: Secondary | ICD-10-CM

## 2017-01-28 DIAGNOSIS — Y929 Unspecified place or not applicable: Secondary | ICD-10-CM | POA: Diagnosis not present

## 2017-01-28 DIAGNOSIS — Y999 Unspecified external cause status: Secondary | ICD-10-CM | POA: Diagnosis not present

## 2017-01-28 DIAGNOSIS — S6991XA Unspecified injury of right wrist, hand and finger(s), initial encounter: Secondary | ICD-10-CM | POA: Diagnosis present

## 2017-01-28 DIAGNOSIS — Y939 Activity, unspecified: Secondary | ICD-10-CM | POA: Diagnosis not present

## 2017-01-28 DIAGNOSIS — W278XXA Contact with other nonpowered hand tool, initial encounter: Secondary | ICD-10-CM | POA: Diagnosis not present

## 2017-01-28 DIAGNOSIS — Z79818 Long term (current) use of other agents affecting estrogen receptors and estrogen levels: Secondary | ICD-10-CM | POA: Insufficient documentation

## 2017-01-28 MED ORDER — TETANUS-DIPHTH-ACELL PERTUSSIS 5-2.5-18.5 LF-MCG/0.5 IM SUSP
0.5000 mL | Freq: Once | INTRAMUSCULAR | Status: AC
  Administered 2017-01-28: 0.5 mL via INTRAMUSCULAR
  Filled 2017-01-28: qty 0.5

## 2017-01-28 MED ORDER — OXYCODONE-ACETAMINOPHEN 5-325 MG PO TABS: 1.0000 | ORAL_TABLET | Freq: Four times a day (QID) | ORAL | 0 refills | Status: DC | PRN

## 2017-01-28 MED ORDER — LIDOCAINE HCL (PF) 1 % IJ SOLN
INTRAMUSCULAR | Status: AC
  Filled 2017-01-28: qty 5

## 2017-01-28 NOTE — ED Triage Notes (Signed)
Patient presents to the ED with a laceration to her right thumb.  Patient states she was opening boxes with a box cutter and her hand slipped and cut her thumb.  Thumb has a dressing on thumb at this time.

## 2017-01-28 NOTE — Discharge Instructions (Signed)
Finger splint for 2-3 days as directed

## 2017-01-28 NOTE — ED Notes (Signed)
Pt alert and oriented X4, active, cooperative, pt in NAD. RR even and unlabored, color WNL.  Pt informed to return if any life threatening symptoms occur.   

## 2017-01-28 NOTE — ED Provider Notes (Signed)
[facility] Emergency Department Provider Note   ____________________________________________   First MD Initiated Contact with Patient 01/28/17 [phone]     (approximate)  I have reviewed the triage vital signs and the nursing notes.   HISTORY  Chief Complaint Laceration  HPI [name] is a 41 y.o. female patient complaining of pain secondary to laceration to the right thumb. Patient thumb was is lacerated with a box cutter. Patient states tetanus shot is not up-to-date. Patient denies loss of sensation. No loss of function of the affected digit. Bleeding is controlled with direct pressure. Patient rates the pain as a 5/10. Patient described a pain as "achy".   Past Medical History:  Diagnosis Date  . Dyspareunia, female   . Gastritis   . Interstitial cystitis   . Urinary urgency     Patient Active Problem List   Diagnosis Date Noted  . DIARRHEA 11/06/2008  . ABDOMINAL PAIN-RLQ 11/06/2008    Past Surgical History:  Procedure Laterality Date  . ABDOMINAL HYSTERECTOMY    . BLADDER SURGERY    . CESAREAN SECTION      Prior to Admission medications   Medication Sig Start Date End Date Taking? Authorizing Provider  estrogens, conjugated, (PREMARIN) 1.25 MG tablet Take 1 tablet (1.25 mg total) by mouth daily. 08/11/11 08/10/12  Reva Bores, MD  estrogens, conjugated, (PREMARIN) 1.25 MG tablet Take 1 tablet (1.25 mg total) by mouth daily. 09/13/12   Reva Bores, MD  loperamide (IMODIUM A-D) 2 MG tablet Take 1 tablet (2 mg total) by mouth 4 (four) times daily as needed for diarrhea or loose stools. 09/23/16   Rockne Menghini, MD  ondansetron (ZOFRAN ODT) 4 MG disintegrating tablet Take 1 tablet (4 mg total) by mouth every 8 (eight) hours as needed for nausea or vomiting. 09/23/16   Rockne Menghini, MD  oxyCODONE-acetaminophen (ROXICET) 5-325 MG tablet Take 1 tablet by mouth every 4 (four) hours as needed. 09/23/16 09/23/17  Rockne Menghini, MD  oxyCODONE-acetaminophen (ROXICET) 5-325 MG tablet Take 1 tablet by mouth every 6 (six) hours as needed for moderate pain. 01/28/17   Joni Reining, PA-C    Allergies Patient has no known allergies.  No family history on file.  Social History Social History  Substance Use Topics  . Smoking status: Current Every Day Smoker  . Smokeless tobacco: Current User  . Alcohol use No    Review of Systems Constitutional: No fever/chills Eyes: No visual changes. ENT: No sore throat. Cardiovascular: Denies chest pain. Respiratory: Denies shortness of breath. Gastrointestinal: No abdominal pain.  No nausea, no vomiting.  No diarrhea.  No constipation. Genitourinary: Negative for dysuria. Musculoskeletal: Negative for back pain. Skin: Negative for rash. Laceration right thumb Neurological: Negative for headaches, focal weakness or numbness.   ____________________________________________   PHYSICAL EXAM:  VITAL SIGNS: ED Triage Vitals  Enc Vitals Group     BP 01/28/17 0910 108/60     Pulse Rate 01/28/17 0910 (!) 116     Resp 01/28/17 0910 18     Temp 01/28/17 0910 98.5 F (36.9 C)     Temp Source 01/28/17 0910 Oral     SpO2 01/28/17 0910 100 %     Weight 01/28/17 0906 130 lb (59 kg)     Height 01/28/17 0906 5\' 1"  (1.549 m)     Head Circumference --      Peak Flow --      Pain Score 01/28/17 0905 5  Pain Loc --      Pain Edu? --      Excl. in GC? --     Constitutional: Alert and oriented. Well appearing and in no acute distress.Anxious. Cardiovascular: Normal rate, regular rhythm. Grossly normal heart sounds.  Good peripheral circulation. Respiratory: Normal respiratory effort.  No retractions. Lungs CTAB. Musculoskeletal: No lower extremity tenderness nor edema.  No joint effusions. Neurologic:  Normal speech and language. No gross focal neurologic deficits are appreciated. No gait instability. Skin:  Skin is warm, dry and intact. No rash noted. 1.5  cm laceration right thumb Psychiatric: Mood and affect are normal. Speech and behavior are normal.  ____________________________________________   LABS (all labs ordered are listed, but only abnormal results are displayed)  Labs Reviewed - No data to display ____________________________________________  EKG   ____________________________________________  RADIOLOGY  No results found.  ____________________________________________   PROCEDURES  Procedure(s) performed: LACERATION REPAIR Performed by: Joni Reiningonald K Nakiea Metzner Authorized by: Joni Reiningonald K Sharae Zappulla Consent: Verbal consent obtained. Risks and benefits: risks, benefits and alternatives were discussed Consent given by: patient Patient identity confirmed: provided demographic data Prepped and Draped in normal sterile fashion Wound explored  Laceration Location: Right thumb  Laceration Length: 1.5cm  No Foreign Bodies seen or palpated  Anesthesia: Digital block Local anesthetic: lidocaine 1% without epinephrine  Anesthetic total: 4 ml  Irrigation method: syringe Amount of cleaning: standard  Skin closure: Dermabond     Patient tolerance: Patient tolerated the procedure well with no immediate complications.   Procedures  Critical Care performed: No  ____________________________________________   INITIAL IMPRESSION / ASSESSMENT AND PLAN / ED COURSE  Pertinent labs & imaging results that were available during my care of the patient were reviewed by me and considered in my medical decision making (see chart for details).  Right thumb laceration. Patient given discharge care instructions. Patient advised to wear splint for 2-3 days. Return by ER if wound reopened for complete healing.      ____________________________________________   FINAL CLINICAL IMPRESSION(S) / ED DIAGNOSES  Final diagnoses:  Laceration of right thumb without foreign body without damage to nail, initial encounter      NEW  MEDICATIONS STARTED DURING THIS VISIT:  New Prescriptions   OXYCODONE-ACETAMINOPHEN (ROXICET) 5-325 MG TABLET    Take 1 tablet by mouth every 6 (six) hours as needed for moderate pain.     Note:  This document was prepared using Dragon voice recognition software and may include unintentional dictation errors.    Joni ReiningSmith, Nezzie Manera K, PA-C 01/28/17 [id number]    Minna AntisPaduchowski, Kevin, MD 01/28/17 [phone]

## 2017-04-12 ENCOUNTER — Encounter: Payer: Self-pay | Admitting: Emergency Medicine

## 2017-04-12 ENCOUNTER — Emergency Department
Admission: EM | Admit: 2017-04-12 | Discharge: 2017-04-12 | Disposition: A | Discharge status: Home / Self Care | Payer: Medicaid Other | Attending: Emergency Medicine | Admitting: Emergency Medicine

## 2017-04-12 ENCOUNTER — Emergency Department: Payer: Medicaid Other

## 2017-04-12 DIAGNOSIS — Z5321 Procedure and treatment not carried out due to patient leaving prior to being seen by health care provider: Secondary | ICD-10-CM | POA: Insufficient documentation

## 2017-04-12 DIAGNOSIS — M79672 Pain in left foot: Secondary | ICD-10-CM | POA: Diagnosis present

## 2017-04-12 NOTE — ED Triage Notes (Signed)
Patient states that her dog knocked a table over on her left foot this morning. Patient with pain and bruising to left foot.

## 2017-04-12 NOTE — ED Notes (Signed)
Pt camer to desk stating she will call her doctor when they open and she is leaving.

## 2017-11-13 ENCOUNTER — Emergency Department
Admission: EM | Admit: 2017-11-13 | Discharge: 2017-11-13 | Disposition: A | Discharge status: Home / Self Care | Payer: Self-pay | Attending: Emergency Medicine | Admitting: Emergency Medicine

## 2017-11-13 ENCOUNTER — Telehealth: Payer: Self-pay | Admitting: Emergency Medicine

## 2017-11-13 ENCOUNTER — Emergency Department: Payer: Medicaid Other

## 2017-11-13 DIAGNOSIS — Z5321 Procedure and treatment not carried out due to patient leaving prior to being seen by health care provider: Secondary | ICD-10-CM | POA: Insufficient documentation

## 2017-11-13 DIAGNOSIS — R079 Chest pain, unspecified: Secondary | ICD-10-CM | POA: Insufficient documentation

## 2017-11-13 LAB — CBC
HCT: 42.7 % (ref 35.0–47.0)
Hemoglobin: 14.4 g/dL (ref 12.0–16.0)
MCH: 28.9 pg (ref 26.0–34.0)
MCHC: 33.8 g/dL (ref 32.0–36.0)
MCV: 85.4 fL (ref 80.0–100.0)
PLATELETS: 142 10*3/uL — AB (ref 150–440)
RBC: 5 MIL/uL (ref 3.80–5.20)
RDW: 14.2 % (ref 11.5–14.5)
WBC: 7.5 10*3/uL (ref 3.6–11.0)

## 2017-11-13 LAB — BASIC METABOLIC PANEL
Anion gap: 11 (ref 5–15)
BUN: 19 mg/dL (ref 6–20)
CHLORIDE: 102 mmol/L (ref 101–111)
CO2: 25 mmol/L (ref 22–32)
CREATININE: 0.92 mg/dL (ref 0.44–1.00)
Calcium: 9.6 mg/dL (ref 8.9–10.3)
GFR calc non Af Amer: >60 mL/min (ref >60)
Glucose, Bld: 90 mg/dL (ref 65–99)
Potassium: 3.8 mmol/L (ref 3.5–5.1)
SODIUM: 138 mmol/L (ref 135–145)

## 2017-11-13 LAB — TROPONIN I

## 2017-11-13 NOTE — ED Notes (Signed)
Pt not in lobby.  

## 2017-11-13 NOTE — Telephone Encounter (Signed)
Called patient due to lwot to inquire about condition and follow up plans. Left message.   

## 2017-11-13 NOTE — ED Triage Notes (Signed)
Patient c/o chest pain radiating to left arm/hand, back and neck. Patient reports accompanying symptoms of SOB, weakness, dizziness. Patient denies N/V.

## 2018-07-05 IMAGING — CR DG CHEST 2V
2 series · 2 of 2 positions shown · non-contrast
Comparison: 09/23/2016

CLINICAL DATA: Chest pain radiating to the left arm, hand, back,
and neck. Shortness of breath, weakness, and dizziness. Current
smoker.

EXAM:
CHEST - 2 VIEW

[chest pa]
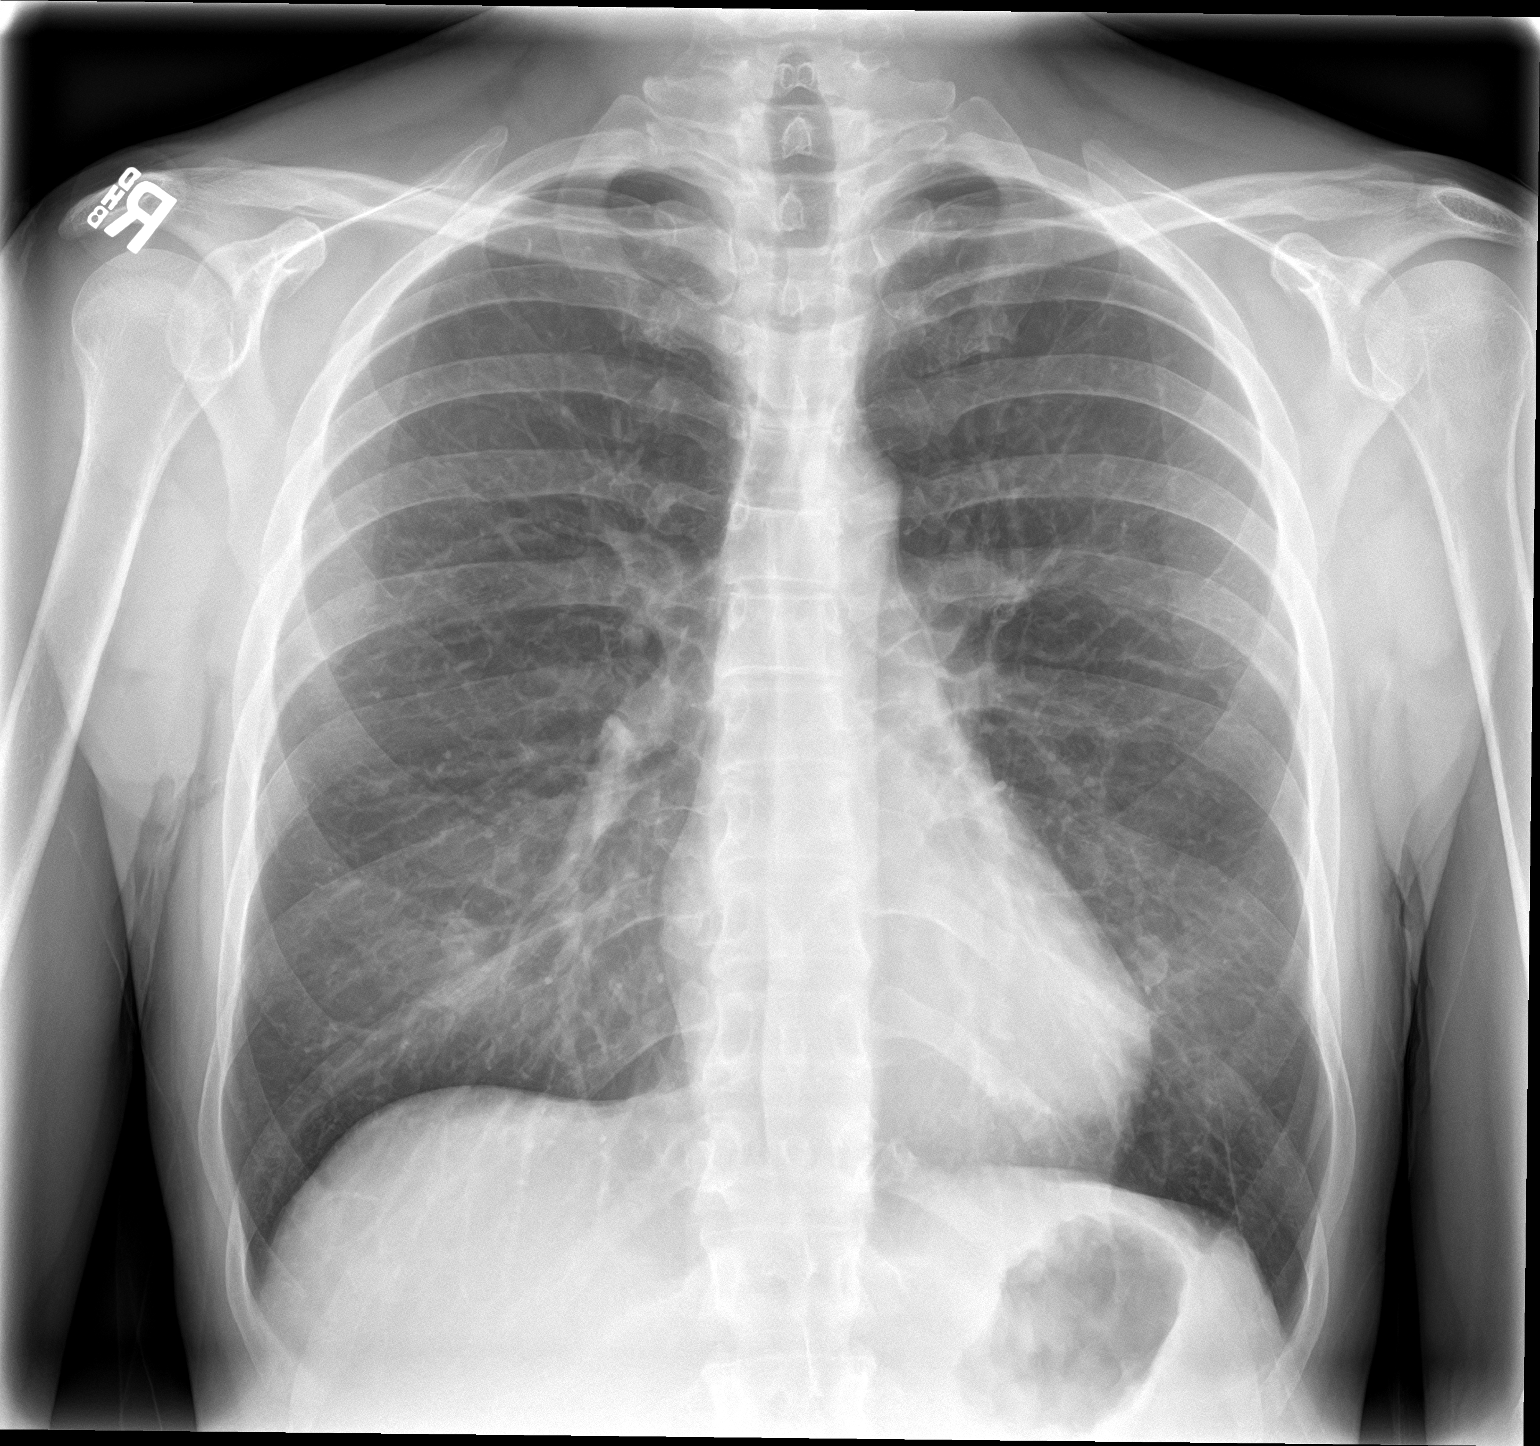

[chest lat]
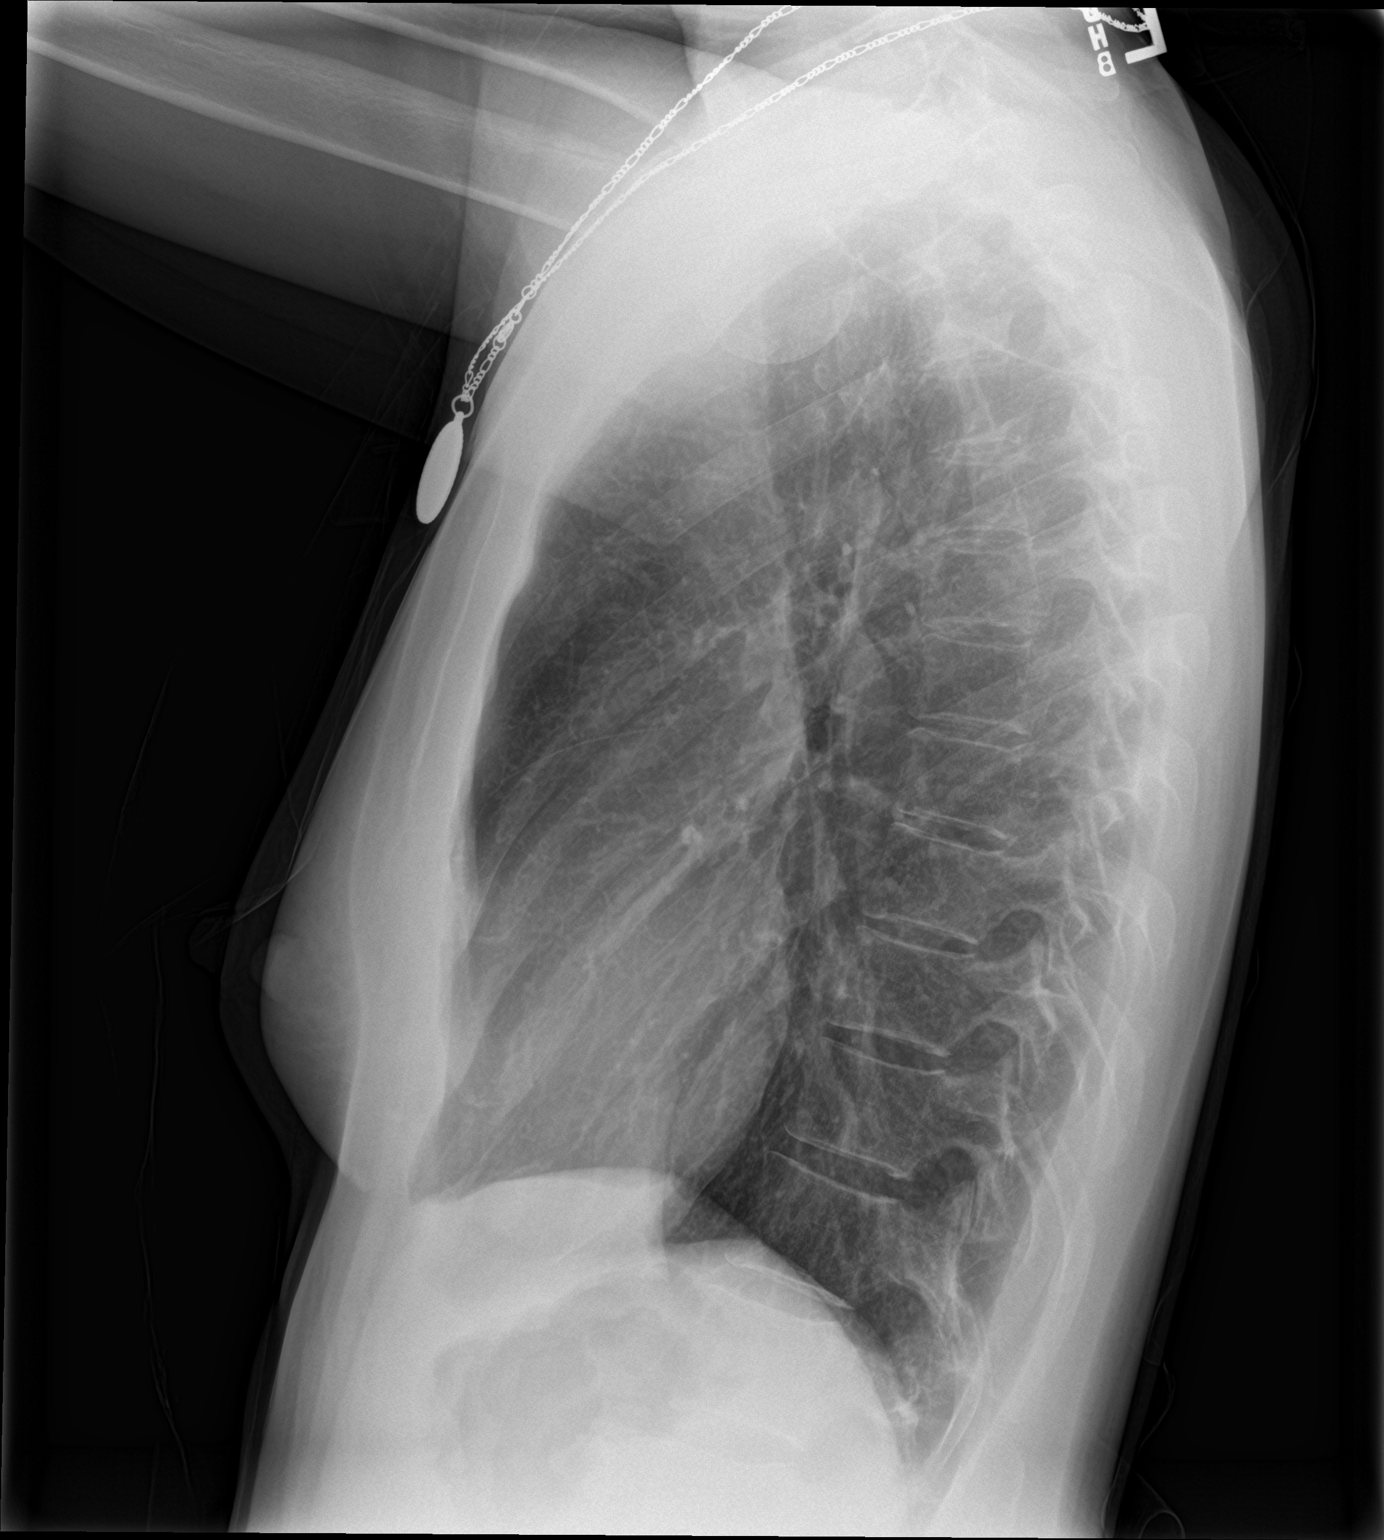

[2 of 2 positions shown; findings below may reference images not displayed]

FINDINGS: Normal heart size and pulmonary vascularity. Peribronchial
thickening suggesting chronic bronchitis. No airspace disease or
consolidation in the lungs. No blunting of costophrenic angles. No
pneumothorax. Mediastinal contours appear intact.
IMPRESSION: Chronic bronchitic changes in the lungs. No evidence of active
pulmonary disease.

## 2019-03-31 ENCOUNTER — Other Ambulatory Visit: Payer: Self-pay

## 2019-03-31 DIAGNOSIS — Z20822 Contact with and (suspected) exposure to covid-19: Secondary | ICD-10-CM

## 2019-04-01 LAB — NOVEL CORONAVIRUS, NAA: SARS-CoV-2, NAA: NOT DETECTED

## 2020-08-03 ENCOUNTER — Telehealth: Payer: Self-pay | Admitting: *Deleted

## 2020-08-03 ENCOUNTER — Encounter: Payer: Self-pay | Admitting: *Deleted

## 2020-08-03 MED ORDER — ONDANSETRON 8 MG PO TBDP: 8.0000 mg | ORAL_TABLET | Freq: Three times a day (TID) | ORAL | 0 refills | Status: DC | PRN

## 2020-08-03 NOTE — Telephone Encounter (Signed)
RN notified by HR that pt was sent home today after becoming "overheated" and vomiting in dept.  Spoke with pt by phone. She reports hx of episodes of vomiting x1 after getting too hot ever since a bad sunburn in 2010. She typically feels better after vomiting once though but now she is having continued nausea. Pt vomited again while on phone with RN. Advised pt that sx could be r/t Covid or viral gastroenteritis.  Zofran listed on med list from 2018. She reports she didn't think this helped much then but willing to try now. Rx sent to pharmacy of choice. Advised hydration as much as possible, sips of water/gatorade/Propel every 15-20 minutes as tolerated.  Quarantine and testing indicated.  Day 0 1/14 Day 5 1/19 RTW 1/20 with strict mask use thru 1/24.  Testing appt scheduled at [facility] for tomorrow 1/15 at 10:00.

## 2020-08-03 NOTE — Telephone Encounter (Signed)
Reviewed RN Rolly Salter note agree with plan of care will follow up with patient via telephone this weekend.

## 2020-08-04 ENCOUNTER — Other Ambulatory Visit: Payer: Medicaid Other

## 2020-08-04 ENCOUNTER — Other Ambulatory Visit: Payer: Self-pay

## 2020-08-04 DIAGNOSIS — Z20822 Contact with and (suspected) exposure to covid-19: Secondary | ICD-10-CM

## 2020-08-06 NOTE — Telephone Encounter (Signed)
No answer. VM full, unable to leave mesg.

## 2020-08-07 LAB — NOVEL CORONAVIRUS, NAA: SARS-CoV-2, NAA: NOT DETECTED

## 2020-08-07 NOTE — Telephone Encounter (Signed)
Noted no answer

## 2020-08-07 NOTE — Telephone Encounter (Signed)
Spoke with pt by phone this morning. She reports 2 additional episodes of vomiting Friday but all resolved by Saturday morning. She is back to a normal diet and toleratin fluids well. Has not needed Zofran since Friday.  Covid test results still pending. RTW remains 1/20 if results are back by then.

## 2020-08-07 NOTE — Telephone Encounter (Signed)
Reviewed RN Rolly Salter note test covid results still pending and Day 10 08/08/2020 and RTW 08/09/2020 if test results still pending; symptoms resolved.

## 2020-08-08 NOTE — Telephone Encounter (Signed)
Per epic review covid test negative.  Contacted patient via telephone notified patient she verbalized understanding information.  Still feeling well and able to return to work tomorrow 08/09/2020 Day 10 today 08/08/2020.  A&Ox3 spoke full sentences without difficulty no cough/nasal congestion/throat clearing noted during telephone call.  HR notified cleared to return to work and negative test.  Patient agreed with plan of care and no further questions at this time.

## 2020-08-09 NOTE — Telephone Encounter (Signed)
Noted RTW as expected and symptoms resolved.

## 2020-08-09 NOTE — Telephone Encounter (Signed)
Confirmed with HR that pt did RTW today 08/09/20 as expected. Closing encounter.

## 2020-08-13 ENCOUNTER — Other Ambulatory Visit: Payer: Self-pay

## 2020-08-13 ENCOUNTER — Telehealth: Payer: Self-pay | Admitting: *Deleted

## 2020-08-13 ENCOUNTER — Ambulatory Visit: Payer: Self-pay | Admitting: *Deleted

## 2020-08-13 VITALS — BP 119/75 | HR 76 | Temp 94.6°F

## 2020-08-13 DIAGNOSIS — R5383 Other fatigue: Secondary | ICD-10-CM

## 2020-08-13 DIAGNOSIS — R110 Nausea: Secondary | ICD-10-CM

## 2020-08-13 NOTE — Telephone Encounter (Signed)
Changed to appt

## 2020-08-13 NOTE — Progress Notes (Signed)
RN notified by HR Tim that pt told supervisor that she feels she has some other health issues going on and is going to the ED for evaluation. Plan to f/u by chart review and will call pt when appropriate after discharge.

## 2020-08-13 NOTE — Progress Notes (Signed)
RN notified by coworker that pt was falling asleep, and leaning over cart in warehouse and other coworkers were concerned for her, voiced specific concern that she may be under the influence of drugs/ETOH. 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 Noted that pt was fidgety, restless and had spilled water on cart. Reported she was overheated and sweating. Warehouse cool, comfortable. Weather outside in 40's. Pt in long sleeve tee shirt and jeans. She attributes sx to severe sunburn she had 10+ years ago and sts sx recur a few times a year. She was just out on quarantine with a negative test for overheating and n/v. Those sx resolved within 24 hours for her.  Brought pt back to clinic for vitals. Temp low. Pt noted to nod off once and required RN calling her name and shaking her shoulder gently to respond. When she was nodding off, pulse ox dropped to 85% with pulse in 70's. When she woke up, O2 up to 95% and pulse 90-100. Pupils noted to be pinpoint.   Asked pt about drug use recently/today and she denies. Sts she only takes BC powder and a multivitamin.   Pt became nauseous while in clinic. Sat in floor with head between knees and reports feeling better after a few minutes. No emesis. Pt A&Ox3. Ambulatory on her own. 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 She reports she is going to go on to lunch break and eat as she hasn't yet today. Plan for RN to check back in on pt around 1215 after her break. HR Salley Slaughter made aware of concerns. Will follow up.

## 2020-08-14 NOTE — Telephone Encounter (Signed)
Follow up with pt by phone. She reports that she didn't go to the ED yesterday because she didn't have anyone to drive her home. She is seeing primary care today at 1430. She is still experiencing sx like dizziness, lightheadedness, n/v, diaphoresis/hot flashes, mental fog. She feels that sx could be related to taking Kratom and a multivitamin. She has been taking Kratom for over 2 years now with no problems but did just start using a different brand. She is taking 6 tabs a day. Plan to f/u with pt at 1530 today to discuss her MD appt and plans for work tomorrow. Pt agreeable.

## 2020-08-14 NOTE — Telephone Encounter (Signed)
Noted reviewed RN Rolly Salter note.  Discussed I do not recommend taking Kratom by any patients.  RN Rolly Salter or I will follow up with patient via telephone in the next 24 hours.

## 2020-08-15 NOTE — Telephone Encounter (Signed)
No Answer left message to call me at home tonight before 2100 or may call RN Rolly Salter tomorrow at [phone]

## 2020-08-16 NOTE — Telephone Encounter (Signed)
No answer. LVMRCB. 

## 2020-08-16 NOTE — Telephone Encounter (Signed)
Notified by Jasmine December HR patient returned to work today with note from her doctor and case could be closed.

## 2020-09-21 ENCOUNTER — Telehealth: Payer: Self-pay | Admitting: *Deleted

## 2020-10-08 ENCOUNTER — Telehealth: Payer: Self-pay | Admitting: *Deleted

## 2020-10-08 ENCOUNTER — Encounter: Payer: Self-pay | Admitting: *Deleted

## 2020-10-08 NOTE — Telephone Encounter (Signed)
RN notified by HR that pt was no call, no show for work today. They were able to reach her and she reported loss of voice and sore throat. Attempted to contact pt to discuss sx. No answer. VM full, unable to leave mesg. Will try again later this afternoon.

## 2020-10-08 NOTE — Telephone Encounter (Signed)
Reviewed RN Rolly Salter note agreed with plan of care will follow up with patient tomorrow for telehealth visit to see if symptoms improved or if further evaluation required.

## 2020-10-08 NOTE — Telephone Encounter (Signed)
Able to reach pt by phone. Loss of voice. Endorses sore throat, painful to swallow, PND, rhinorrhea, HA, fatigue. Denies sinus pain/pressure, body aches, fever/chills.   Denies seasonal allergy Hx. Has been using honey at home for sore throat. No other home/otc use.  Day 0 3/19 Day 5 3/24 Test 3/22 at 0940 at [facility] RTW 3/25 if sx improving and test results back.  Reviewed home sx management including honey/lemon/tea, warm salty broths, popsicles, nasal saline spray, phenylephrine, Tylenol, hydration. Pt verbalizes understanding and agreement. Denies further questions/concerns.

## 2020-10-09 NOTE — Telephone Encounter (Signed)
Attempted to call to f/u on sx. No answer. VM full.

## 2020-10-11 NOTE — Telephone Encounter (Signed)
Noted no answer reviewed RN Rolly Salter note and that she sent patient email since voicemail continues full

## 2020-10-12 NOTE — Telephone Encounter (Signed)
Reviewed RN Rolly Salter note patient reported negative covid test results and plans to return onsite Monday 10/15/20.

## 2020-10-12 NOTE — Telephone Encounter (Signed)
Pt responded back to email this morning with negative covid test result. Asked her to call me but she reports speaker on her cell phone broken and no one can hear her. Reports sx much improved since Monday. With runny nose and still no voice currently. Taking Tylenol, Sudafed and a nose spray at home per her email. She plans to RTW Monday 3/28. Pt cleared to RTW and HR made aware of same. Advised pt to follow normal call out procedures if for some reason she can't report to work as planned Monday.

## 2020-10-15 NOTE — Telephone Encounter (Signed)
Reviewed RN Rolly Salter note.  Agree with plan of care for patient to see PCM as new fever today.

## 2020-10-15 NOTE — Telephone Encounter (Signed)
Late entry spoke with patient via telephone and she stated her voice still going in and out/raspy feels like lump back there denied white spots in throat or back of mouth.  Using honey for cough and sudafed along with BC powder for intermittent headache.  Feeling better otherwise.  Not taking any allergy or nose spray medications.  Discussed post nasal drip frequently causes irritated vocal cords/swollen/voice changes.  Encouraged continue salty soup broths/honey/salt water gargle and to start flonase nasal spray 1 spray each nostril BID and restart nasal saline 2 sprays each nostril q2h prn congestion/mucous.  She has both at home.  Discussed do not sniff flonase as will pull into throat and only make sore throat worse.  Very light inhale to keep medication in nostrils/sinuses.  Nasal saline sniff until she feels running into throught as saline used to clean out passages.  Use saline first wait 15 minutes then administer flonase.  Discussed with patient I will be in clinic Tuesday 3/29 and RN Rolly Salter in clinic tomorrow 3/28.  She can schedule appt with me if she prefers.  Patient A&Ox3 no nasal sniffing/throat clearing or cough noted during 3 minute telephone call.  Respirations even and unlabored spoke full sentences without difficulty.  Consider starting claritin or zyrtec generic 10mg  po daily also.  Discussed flonase works against viruses/allergies and bacteria causing runny nose/post nasal drip and nasal/sinus congestion.  Patient cleared to return onsite 3/28 as no new symptoms or fever/chills/vomiting/diarrhea.  Patient verbalized understanding information/instructions agreed with plan of care and had no further questions at this time.

## 2020-10-15 NOTE — Telephone Encounter (Signed)
Pt left VM and sent email that she woke up with a fever of 100F today and has an appt with her own doctor this afternoon regarding her continued sore throat/laryngitis sx. Her Day 10 from our isolation/quarantine is tomorrow 3/29, so additional time out of work will be deferred to her pcp and their recommendations/work notes. Replied back to pt's email (since she previously reported phone broken) and advised that she was correct to not report to work and to notify clinic and HR/supervisor of her provider's recommendations after appt today.

## 2020-10-18 NOTE — Telephone Encounter (Signed)
HR Jasmine December reports pt did RTW after pcp clearance on 10/16/20. Closing encounter.

## 2020-10-18 NOTE — Telephone Encounter (Signed)
Reviewed RN Rolly Salter note patient RTW as expected after pcm APPT.

## 2022-03-10 ENCOUNTER — Ambulatory Visit: Payer: Medicaid Other
